# Patient Record
Sex: Female | Born: 1995
Health system: Southern US, Community
[De-identification: ages and names within clinical notes are randomized; demographics above are authoritative.]

## PROBLEM LIST (undated history)

## (undated) DIAGNOSIS — A549 Gonococcal infection, unspecified: Secondary | ICD-10-CM

## (undated) DIAGNOSIS — A749 Chlamydial infection, unspecified: Secondary | ICD-10-CM

## (undated) DIAGNOSIS — M545 Low back pain, unspecified: Secondary | ICD-10-CM

## (undated) DIAGNOSIS — N3281 Overactive bladder: Secondary | ICD-10-CM

## (undated) DIAGNOSIS — G43909 Migraine, unspecified, not intractable, without status migrainosus: Secondary | ICD-10-CM

## (undated) DIAGNOSIS — Z3009 Encounter for other general counseling and advice on contraception: Secondary | ICD-10-CM

## (undated) DIAGNOSIS — F411 Generalized anxiety disorder: Secondary | ICD-10-CM

## (undated) HISTORY — DX: Gonococcal infection, unspecified: A54.9

## (undated) HISTORY — DX: Overactive bladder: N32.81

## (undated) HISTORY — PX: WISDOM TOOTH EXTRACTION: SHX21

## (undated) HISTORY — DX: Generalized anxiety disorder: F41.1

## (undated) HISTORY — DX: Low back pain: M54.5

## (undated) HISTORY — DX: Encounter for other general counseling and advice on contraception: Z30.09

## (undated) HISTORY — DX: Migraine, unspecified, not intractable, without status migrainosus: G43.909

## (undated) HISTORY — DX: Low back pain, unspecified: M54.50

## (undated) HISTORY — DX: Chlamydial infection, unspecified: A74.9

---
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
## 2013-03-15 DIAGNOSIS — A749 Chlamydial infection, unspecified: Secondary | ICD-10-CM

## 2013-03-15 DIAGNOSIS — A549 Gonococcal infection, unspecified: Secondary | ICD-10-CM

## 2013-03-15 HISTORY — DX: Chlamydial infection, unspecified: A74.9

## 2013-03-15 HISTORY — DX: Gonococcal infection, unspecified: A54.9

## 2013-03-15 NOTE — L&D Delivery Note (Signed)
Delivery Note At 9:11 AM a viable and healthy female was delivered via Vaginal, Spontaneous Delivery (Presentation: Right Occiput Anterior).  APGAR: 9, 9; weight .   Placenta status: Intact, Spontaneous.  Cord: 3 vessels with the following complications: None.    Anesthesia: Epidural  Episiotomy: None Lacerations: Labial Suture Repair: 4-0 monocryl Est. Blood Loss (mL): 350  Mom to postpartum.  Baby to Couplet care / Skin to Skin.  [facility] 12/09/2013, 9:56 AM

## 2013-03-15 NOTE — L&D Delivery Note (Signed)
`````  Attestation of Attending Supervision of Advanced Practitioner: Evaluation and management procedures were performed by the PA/NP/CNM/OB Fellow under my supervision/collaboration. Chart reviewed and agree with management and plan.  Julian Medina V 12/09/2013 1:05 PM    

## 2013-04-01 IMAGING — CR DG THORACOLUMBAR SPINE 2V
2 series · 2 of 2 positions shown · non-contrast
Comparison: None.

CLINICAL DATA: Winging of right scapula.  Back pain.

THORACOLUMBAR SPINE - 2 VIEW

[view not recorded (1 of 2)]
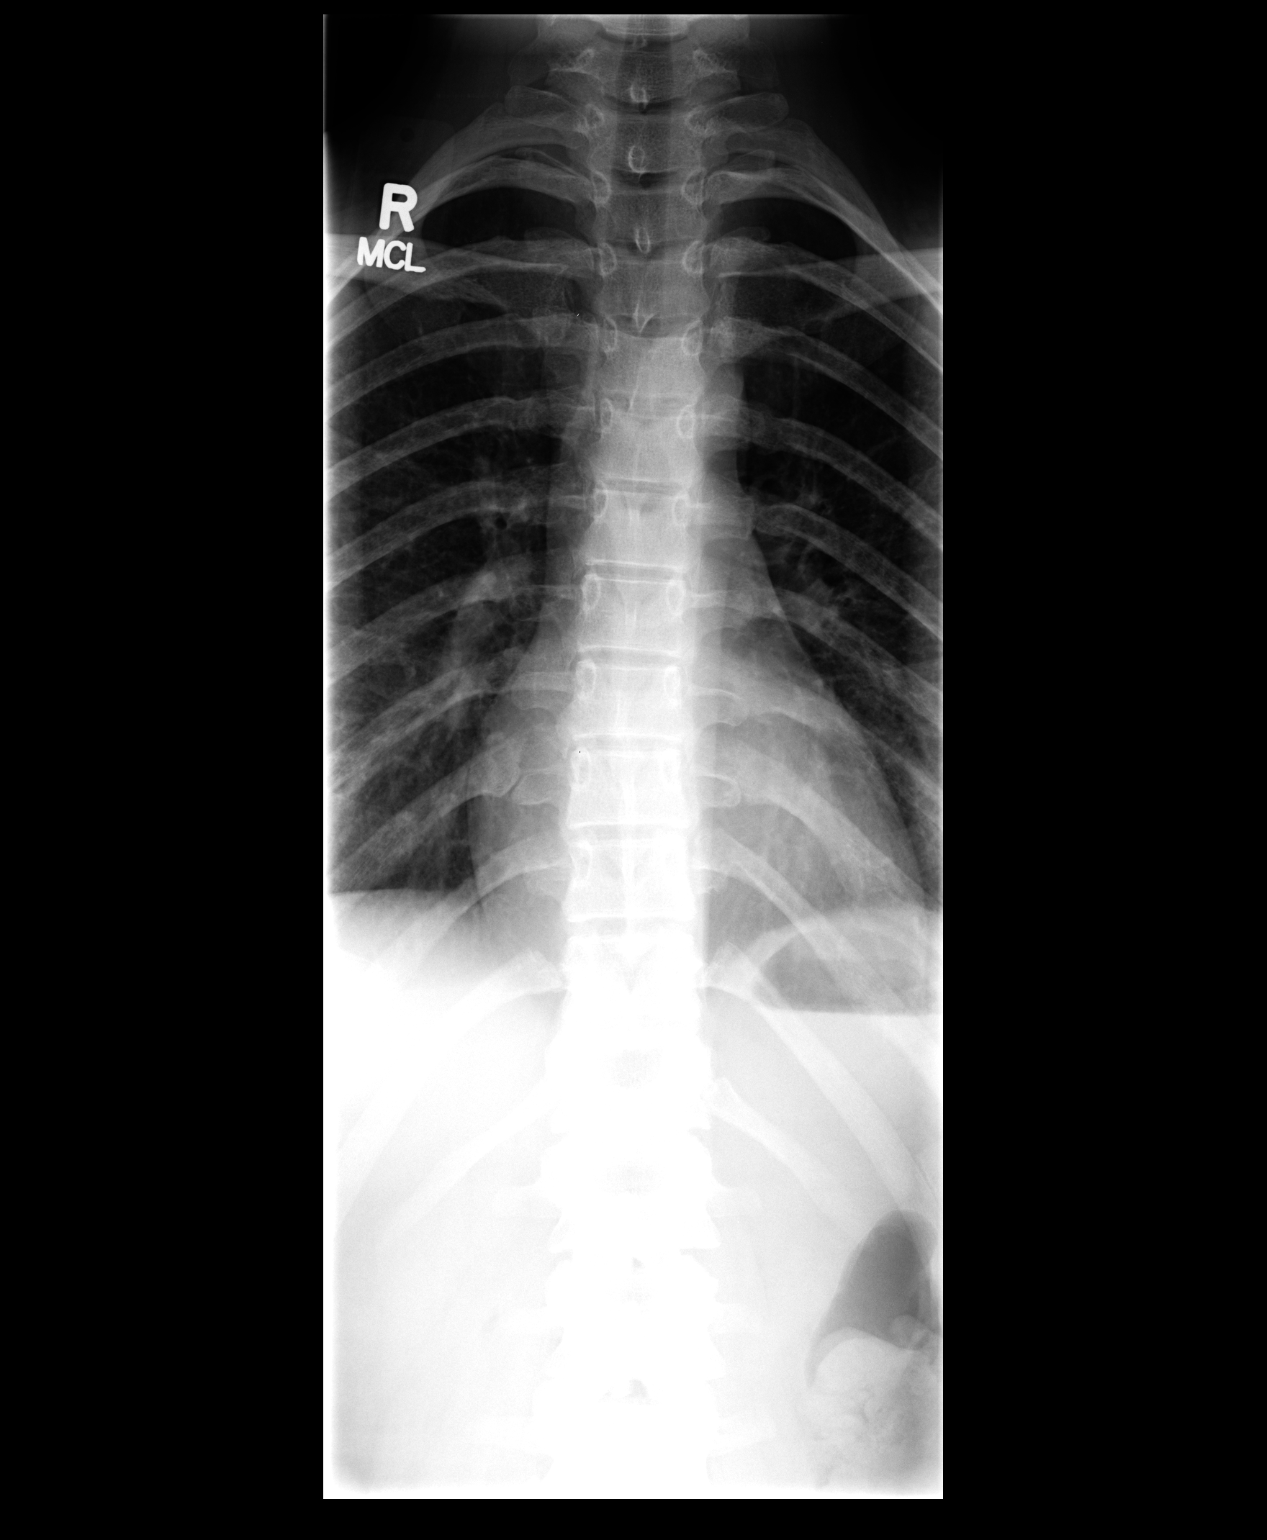

[view not recorded (2 of 2)]
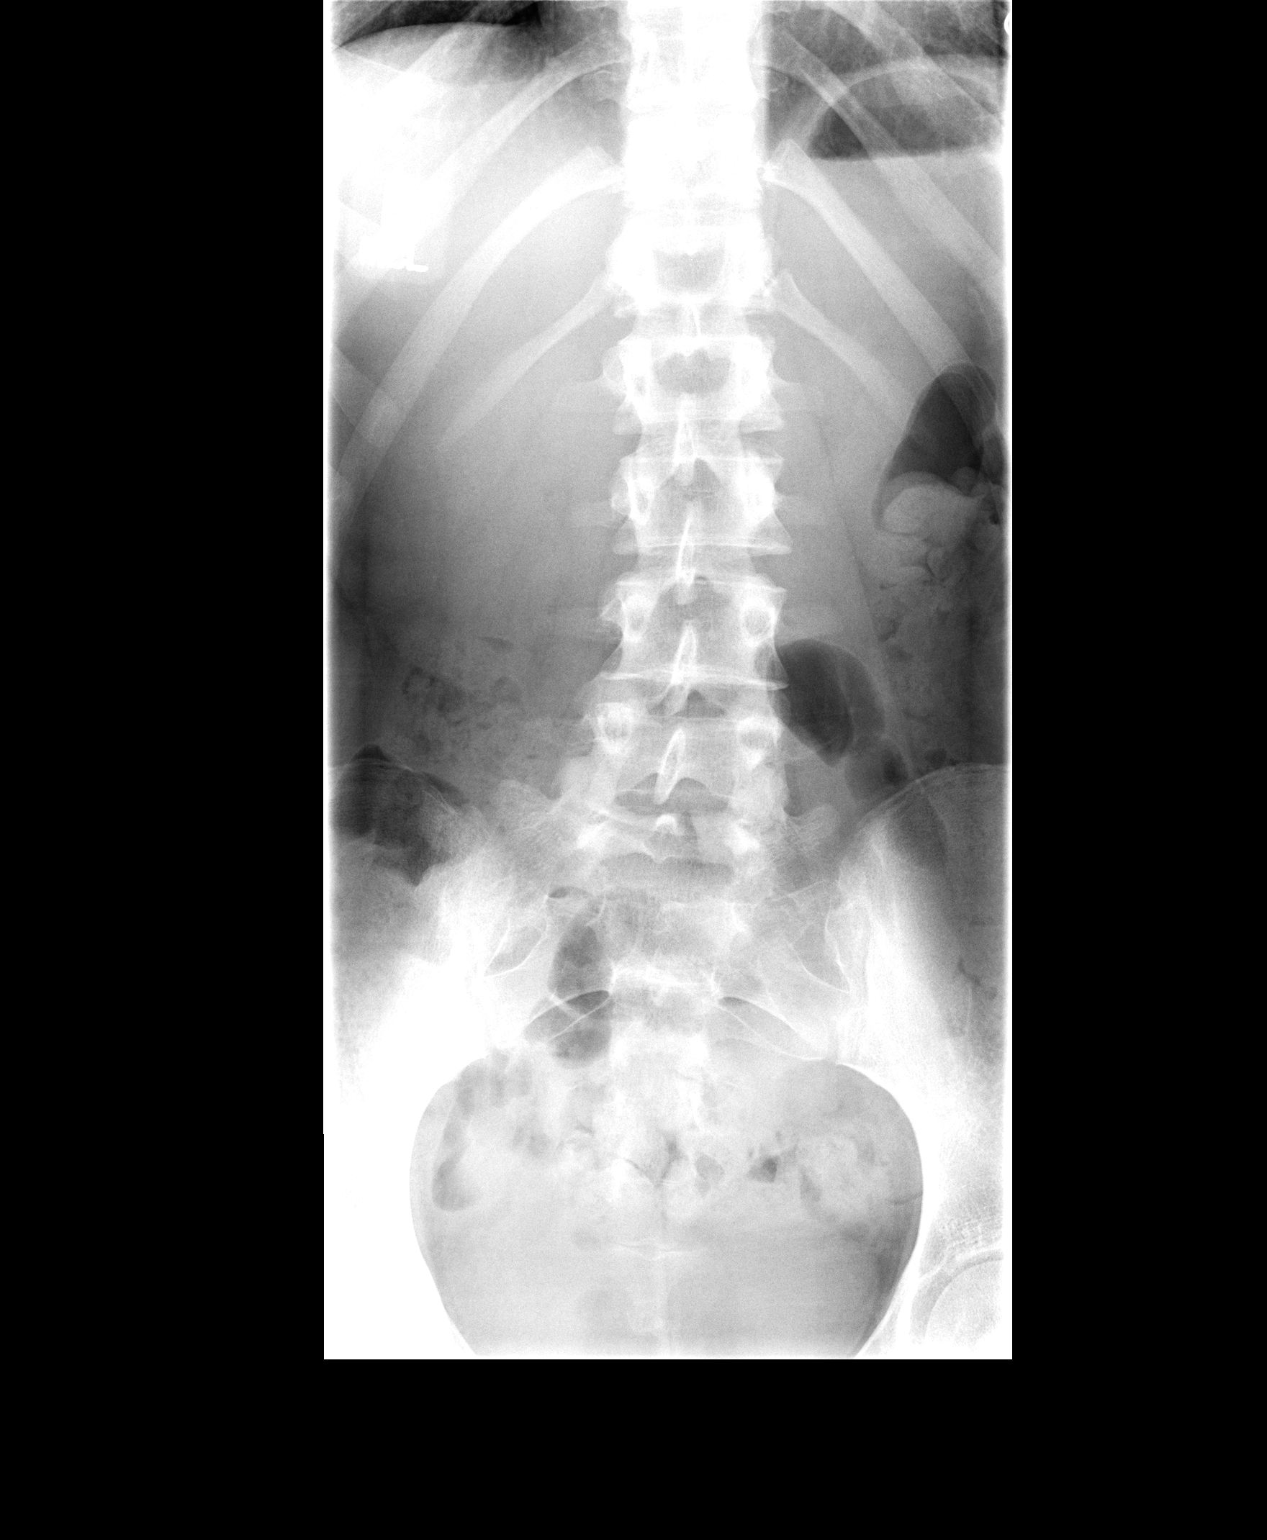

[2 of 2 positions shown; findings below may reference images not displayed]

FINDINGS: Approximately 4 degrees of dextroconvex curvature is
centered at T8-9.  No segmentation anomalies.  Approximately 11
degrees of levoconvex curvature centered at L2-3.
IMPRESSION: Mild S-shaped curvature of the thoracolumbar spine.

## 2013-04-19 ENCOUNTER — Other Ambulatory Visit: Payer: Self-pay | Admitting: Obstetrics & Gynecology

## 2013-04-19 DIAGNOSIS — O3680X0 Pregnancy with inconclusive fetal viability, not applicable or unspecified: Secondary | ICD-10-CM

## 2013-04-20 ENCOUNTER — Telehealth: Payer: Self-pay | Admitting: Obstetrics & Gynecology

## 2013-04-20 NOTE — Telephone Encounter (Signed)
Pt states that she had a little lite spotting this morning. Light pink spotting only once this morning. Pt advised that things sounded normal but she should push her fluids and watch for more spotting. If bright red bleeding or bad cramping occurs call back or go to ER. PT verbalized understanding.

## 2013-04-25 ENCOUNTER — Ambulatory Visit (INDEPENDENT_AMBULATORY_CARE_PROVIDER_SITE_OTHER): Payer: Medicaid Other

## 2013-04-25 DIAGNOSIS — O3680X0 Pregnancy with inconclusive fetal viability, not applicable or unspecified: Secondary | ICD-10-CM

## 2013-04-25 NOTE — Progress Notes (Signed)
US single IUP at [id number] with FHR of 157. Cx appears closed. Bilateral ovaries appear nml.

## 2013-05-02 ENCOUNTER — Encounter: Payer: Self-pay | Admitting: Advanced Practice Midwife

## 2013-05-02 ENCOUNTER — Ambulatory Visit (INDEPENDENT_AMBULATORY_CARE_PROVIDER_SITE_OTHER): Payer: Medicaid Other | Admitting: Advanced Practice Midwife

## 2013-05-02 VITALS — BP 120/60 | Wt 134.0 lb

## 2013-05-02 DIAGNOSIS — Z331 Pregnant state, incidental: Secondary | ICD-10-CM

## 2013-05-02 DIAGNOSIS — O210 Mild hyperemesis gravidarum: Secondary | ICD-10-CM

## 2013-05-02 DIAGNOSIS — O98811 Other maternal infectious and parasitic diseases complicating pregnancy, first trimester: Secondary | ICD-10-CM

## 2013-05-02 DIAGNOSIS — Z3400 Encounter for supervision of normal first pregnancy, unspecified trimester: Secondary | ICD-10-CM

## 2013-05-02 DIAGNOSIS — A749 Chlamydial infection, unspecified: Secondary | ICD-10-CM

## 2013-05-02 DIAGNOSIS — Z3490 Encounter for supervision of normal pregnancy, unspecified, unspecified trimester: Secondary | ICD-10-CM

## 2013-05-02 DIAGNOSIS — O9989 Other specified diseases and conditions complicating pregnancy, childbirth and the puerperium: Secondary | ICD-10-CM

## 2013-05-02 DIAGNOSIS — O98319 Other infections with a predominantly sexual mode of transmission complicating pregnancy, unspecified trimester: Secondary | ICD-10-CM

## 2013-05-02 DIAGNOSIS — Z2839 Other underimmunization status: Secondary | ICD-10-CM

## 2013-05-02 DIAGNOSIS — Z1389 Encounter for screening for other disorder: Secondary | ICD-10-CM

## 2013-05-02 DIAGNOSIS — Z283 Underimmunization status: Secondary | ICD-10-CM

## 2013-05-02 LAB — CBC
HEMATOCRIT: 39.6 % (ref 36.0–46.0)
HEMOGLOBIN: 13.5 g/dL (ref 12.0–15.0)
MCH: 29.1 pg (ref 26.0–34.0)
MCHC: 34.1 g/dL (ref 30.0–36.0)
MCV: 85.3 fL (ref 78.0–100.0)
Platelets: 327 10*3/uL (ref 150–400)
RBC: 4.64 MIL/uL (ref 3.87–5.11)
RDW: 13 % (ref 11.5–15.5)
WBC: 9.6 10*3/uL (ref 4.0–10.5)

## 2013-05-02 LAB — POCT URINALYSIS DIPSTICK
Blood, UA: NEGATIVE
Glucose, UA: NEGATIVE
KETONES UA: NEGATIVE
Nitrite, UA: NEGATIVE
PROTEIN UA: NEGATIVE

## 2013-05-02 NOTE — Patient Instructions (Signed)
Pregnancy - First Trimester During sexual intercourse, millions of sperm go into the vagina. Only 1 sperm will penetrate and fertilize the female egg while it is in the Fallopian tube. One week later, the fertilized egg implants into the wall of the uterus. An embryo begins to develop into a baby. At 6 to 8 weeks, the eyes and face are formed and the heartbeat can be seen on ultrasound. At the end of 12 weeks (first trimester), all the baby's organs are formed. Now that you are pregnant, you will want to do everything you can to have a healthy baby. Two of the most important things are to get good prenatal care and follow your caregiver's instructions. Prenatal care is all the medical care you receive before the baby's birth. It is given to prevent, find, and treat problems during the pregnancy and childbirth. PRENATAL EXAMS  During prenatal visits, your weight, blood pressure, and urine are checked. This is done to make sure you are healthy and progressing normally during the pregnancy.  A pregnant woman should gain 25 to 35 pounds during the pregnancy. However, if you are overweight or underweight, your caregiver will advise you regarding your weight.  Your caregiver will ask and answer questions for you.  Blood work, cervical cultures, other necessary tests, and a Pap test are done during your prenatal exams. These tests are done to check on your health and the probable health of your baby. Tests are strongly recommended and done for HIV with your permission. This is the virus that causes AIDS. These tests are done because medicines can be given to help prevent your baby from being born with this infection should you have been infected without knowing it. Blood work is also used to find out your blood type, previous infections, and follow your blood levels (hemoglobin).  Low hemoglobin (anemia) is common during pregnancy. Iron and vitamins are given to help prevent this. Later in the pregnancy, blood  tests for diabetes will be done along with any other tests if any problems develop.  You may need other tests to make sure you and the baby are doing well. CHANGES DURING THE FIRST TRIMESTER  Your body goes through many changes during pregnancy. They vary from person to person. Talk to your caregiver about changes you notice and are concerned about. Changes can include:  Your menstrual period stops.  The egg and sperm carry the genes that determine what you look like. Genes from you and your partner are forming a baby. The female genes determine whether the baby is a boy or a girl.  Your body increases in girth and you may feel bloated.  Feeling sick to your stomach (nauseous) and throwing up (vomiting). If the vomiting is uncontrollable, call your caregiver.  Your breasts will begin to enlarge and become tender.  Your nipples may stick out more and become darker.  The need to urinate more. Painful urination may mean you have a bladder infection.  Tiring easily.  Loss of appetite.  Cravings for certain kinds of food.  At first, you may gain or lose a couple of pounds.  You may have changes in your emotions from day to day (excited to be pregnant or concerned something may go wrong with the pregnancy and baby).  You may have more vivid and strange dreams. HOME CARE INSTRUCTIONS   It is very important to avoid all smoking, alcohol and non-prescribed drugs during your pregnancy. These affect the formation and growth of the baby.   Avoid chemicals while pregnant to ensure the delivery of a healthy infant.  Start your prenatal visits by the 12th week of pregnancy. They are usually scheduled monthly at first, then more often in the last 2 months before delivery. Keep your caregiver's appointments. Follow your caregiver's instructions regarding medicine use, blood and lab tests, exercise, and diet.  During pregnancy, you are providing food for you and your baby. Eat regular, well-balanced  meals. Choose foods such as meat, fish, milk and other low fat dairy products, vegetables, fruits, and whole-grain breads and cereals. Your caregiver will tell you of the ideal weight gain.  You can help morning sickness by keeping soda crackers at the bedside. Eat a couple before arising in the morning. You may want to use the crackers without salt on them.  Eating 4 to 5 small meals rather than 3 large meals a day also may help the nausea and vomiting.  Drinking liquids between meals instead of during meals also seems to help nausea and vomiting.  A physical sexual relationship may be continued throughout pregnancy if there are no other problems. Problems may be early (premature) leaking of amniotic fluid from the membranes, vaginal bleeding, or belly (abdominal) pain.  Exercise regularly if there are no restrictions. Check with your caregiver or physical therapist if you are unsure of the safety of some of your exercises. Greater weight gain will occur in the last 2 trimesters of pregnancy. Exercising will help:  Control your weight.  Keep you in shape.  Prepare you for labor and delivery.  Help you lose your pregnancy weight after you deliver your baby.  Wear a good support or jogging bra for breast tenderness during pregnancy. This may help if worn during sleep too.  Ask when prenatal classes are available. Begin classes when they are offered.  Do not use hot tubs, steam rooms, or saunas.  Wear your seat belt when driving. This protects you and your baby if you are in an accident.  Avoid raw meat, uncooked cheese, cat litter boxes, and soil used by cats throughout the pregnancy. These carry germs that can cause birth defects in the baby.  The first trimester is a good time to visit your dentist for your dental health. Getting your teeth cleaned is okay. Use a softer toothbrush and brush gently during pregnancy.  Ask for help if you have financial, counseling, or nutritional needs  during pregnancy. Your caregiver will be able to offer counseling for these needs as well as refer you for other special needs.  Do not take any medicines or herbs unless told by your caregiver.  Inform your caregiver if there is any mental or physical domestic violence.  Make a list of emergency phone numbers of family, friends, hospital, and police and fire departments.  Write down your questions. Take them to your prenatal visit.  Do not douche.  Do not cross your legs.  If you have to stand for long periods of time, rotate you feet or take small steps in a circle.  You may have more vaginal secretions that may require a sanitary pad. Do not use tampons or scented sanitary pads. MEDICINES AND DRUG USE IN PREGNANCY  Take prenatal vitamins as directed. The vitamin should contain 1 milligram of folic acid. Keep all vitamins out of reach of children. Only a couple vitamins or tablets containing iron may be fatal to a baby or young child when ingested.  Avoid use of all medicines, including herbs, over-the-counter medicines, not   prescribed or suggested by your caregiver. Only take over-the-counter or prescription medicines for pain, discomfort, or fever as directed by your caregiver. Do not use aspirin, ibuprofen, or naproxen unless directed by your caregiver.  Let your caregiver also know about herbs you may be using.  Alcohol is related to a number of birth defects. This includes fetal alcohol syndrome. All alcohol, in any form, should be avoided completely. Smoking will cause low birth rate and premature babies.  Street or illegal drugs are very harmful to the baby. They are absolutely forbidden. A baby born to an addicted mother will be addicted at birth. The baby will go through the same withdrawal an adult does.  Let your caregiver know about any medicines that you have to take and for what reason you take them. SEEK MEDICAL CARE IF:  You have any concerns or worries during your  pregnancy. It is better to call with your questions if you feel they cannot wait, rather than worry about them. SEEK IMMEDIATE MEDICAL CARE IF:   An unexplained oral temperature above 102 F (38.9 C) develops, or as your caregiver suggests.  You have leaking of fluid from the vagina (birth canal). If leaking membranes are suspected, take your temperature and inform your caregiver of this when you call.  There is vaginal spotting or bleeding. Notify your caregiver of the amount and how many pads are used.  You develop a bad smelling vaginal discharge with a change in the color.  You continue to feel sick to your stomach (nauseated) and have no relief from remedies suggested. You vomit blood or coffee ground-like materials.  You lose more than 2 pounds of weight in 1 week.  You gain more than 2 pounds of weight in 1 week and you notice swelling of your face, hands, feet, or legs.  You gain 5 pounds or more in 1 week (even if you do not have swelling of your hands, face, legs, or feet).  You get exposed to [place] measles and have never had them.  You are exposed to fifth disease or chickenpox.  You develop belly (abdominal) pain. Round ligament discomfort is a common non-cancerous (benign) cause of abdominal pain in pregnancy. Your caregiver still must evaluate this.  You develop headache, fever, diarrhea, pain with urination, or shortness of breath.  You fall or are in a car accident or have any kind of trauma.  There is mental or physical violence in your home. Document Released: 02/23/2001 Document Revised: 11/24/2011 Document Reviewed: 08/27/2008 [facility] Patient Information 2014 [place]. Nausea and Vomiting Nausea is a sick feeling that often comes before throwing up (vomiting). Vomiting is a reflex where stomach contents come out of your mouth. Vomiting can cause severe loss of body fluids (dehydration). Children and elderly adults can become dehydrated quickly,  especially if they also have diarrhea. Nausea and vomiting are symptoms of a condition or disease. It is important to find the cause of your symptoms. CAUSES   Direct irritation of the stomach lining. This irritation can result from increased acid production (gastroesophageal reflux disease), infection, food poisoning, taking certain medicines (such as nonsteroidal anti-inflammatory drugs), alcohol use, or tobacco use.  Signals from the brain.These signals could be caused by a headache, heat exposure, an inner ear disturbance, increased pressure in the brain from injury, infection, a tumor, or a concussion, pain, emotional stimulus, or metabolic problems.  An obstruction in the gastrointestinal tract (bowel obstruction).  Illnesses such as diabetes, hepatitis, gallbladder problems, appendicitis, kidney  problems, cancer, sepsis, atypical symptoms of a heart attack, or eating disorders.  Medical treatments such as chemotherapy and radiation.  Receiving medicine that makes you sleep (general anesthetic) during surgery. DIAGNOSIS Your caregiver may ask for tests to be done if the problems do not improve after a few days. Tests may also be done if symptoms are severe or if the reason for the nausea and vomiting is not clear. Tests may include:  Urine tests.  Blood tests.  Stool tests.  Cultures (to look for evidence of infection).  X-rays or other imaging studies. Test results can help your caregiver make decisions about treatment or the need for additional tests. TREATMENT You need to stay well hydrated. Drink frequently but in small amounts.You may wish to drink water, sports drinks, clear broth, or eat frozen ice pops or gelatin dessert to help stay hydrated.When you eat, eating slowly may help prevent nausea.There are also some antinausea medicines that may help prevent nausea. HOME CARE INSTRUCTIONS   Take all medicine as directed by your caregiver.  If you do not have an  appetite, do not force yourself to eat. However, you must continue to drink fluids.  If you have an appetite, eat a normal diet unless your caregiver tells you differently.  Eat a variety of complex carbohydrates (rice, wheat, potatoes, bread), lean meats, yogurt, fruits, and vegetables.  Avoid high-fat foods because they are more difficult to digest.  Drink enough water and fluids to keep your urine clear or pale yellow.  If you are dehydrated, ask your caregiver for specific rehydration instructions. Signs of dehydration may include:  Severe thirst.  Dry lips and mouth.  Dizziness.  Dark urine.  Decreasing urine frequency and amount.  Confusion.  Rapid breathing or pulse. SEEK IMMEDIATE MEDICAL CARE IF:   You have blood or brown flecks (like coffee grounds) in your vomit.  You have black or bloody stools.  You have a severe headache or stiff neck.  You are confused.  You have severe abdominal pain.  You have chest pain or trouble breathing.  You do not urinate at least once every 8 hours.  You develop cold or clammy skin.  You continue to vomit for longer than 24 to 48 hours.  You have a fever. MAKE SURE YOU:   Understand these instructions.  Will watch your condition.  Will get help right away if you are not doing well or get worse. Document Released: 03/01/2005 Document Revised: 05/24/2011 Document Reviewed: 07/29/2010 [facility] Patient Information 2014 [place], [place].

## 2013-05-02 NOTE — Progress Notes (Signed)
  Subjective:    Sara Foster is a G1P0 7952w3d being seen today for her first obstetrical visit.  Her obstetrical history is significant for nothing. Patient reports fatigue, nausea and vaginal discharge, spotting X 1 Filed Vitals:   05/02/13 1430  BP: 120/60  Weight: 134 lb (60.782 kg)    HISTORY: OB History  Gravida Para Term Preterm AB SAB TAB Ectopic Multiple Living  1             # Outcome Date GA Lbr Len/2nd Weight Sex Delivery Anes PTL Lv  1 CUR              Past Medical History  Diagnosis Date  . Migraines    Past Surgical History  Procedure Laterality Date  . No past surgeries     Family History  Problem Relation Age of Onset  . Heart disease    . Arthritis    . Lung disease    . Cancer    . Asthma       Exam       Pelvic Exam:    Perineum: Normal Perineum   Vulva: normal   Vagina:  normal mucosa, normal discharge, no blood, no palpable nodules   Uterus 8week.  + FCA on u/s        Cervix: normal   Adnexa: Not palpable   Urinary:  urethral meatus normal    System: Breast:  normal appearance, no masses or tenderness   Skin: normal coloration and turgor, no rashes    Neurologic: oriented, normal, normal mood   Extremities: normal strength, tone, and muscle mass   HEENT PERRLA   Mouth/Teeth mucous membranes moist, pharynx normal without lesions   Neck supple and no masses   Cardiovascular: regular rate and rhythm   Respiratory:  appears well, vitals normal, no respiratory distress, acyanotic, normal RR   Abdomen: soft, non-tender; bowel sounds normal; no masses,  no organomegaly          Assessment:    Pregnancy: G1P0 Patient Active Problem List   Diagnosis Date Noted  . Pregnant 05/02/2013  . Leg weakness 01/05/2011  . Neck muscle weakness 01/05/2011  . Scoliosis 12/31/2010        Plan:    Diclegis samples given Initial labs drawn. Continue prenatal vitamins  Problem list reviewed and updated  Reviewed n/v relief measures  and warning s/s to report  Reviewed recommended weight gain based on pre-gravid BMI  Encouraged well-balanced diet Genetic Screening discussed Integrated Screen: requested.  Ultrasound discussed; fetal survey: requested.  Follow up in 4 weeks for NT/IT  CRESENZO-DISHMAN,Taye Cato 05/02/2013

## 2013-05-03 DIAGNOSIS — O98811 Other maternal infectious and parasitic diseases complicating pregnancy, first trimester: Secondary | ICD-10-CM

## 2013-05-03 DIAGNOSIS — Z283 Underimmunization status: Secondary | ICD-10-CM | POA: Insufficient documentation

## 2013-05-03 DIAGNOSIS — O9989 Other specified diseases and conditions complicating pregnancy, childbirth and the puerperium: Secondary | ICD-10-CM

## 2013-05-03 DIAGNOSIS — O09899 Supervision of other high risk pregnancies, unspecified trimester: Secondary | ICD-10-CM | POA: Insufficient documentation

## 2013-05-03 DIAGNOSIS — A749 Chlamydial infection, unspecified: Secondary | ICD-10-CM | POA: Insufficient documentation

## 2013-05-03 LAB — URINALYSIS
Bilirubin Urine: NEGATIVE
GLUCOSE, UA: NEGATIVE mg/dL
Ketones, ur: NEGATIVE mg/dL
Nitrite: NEGATIVE
PH: 5.5 (ref 5.0–8.0)
Protein, ur: NEGATIVE mg/dL
SPECIFIC GRAVITY, URINE: 1.019 (ref 1.005–1.030)
Urobilinogen, UA: 0.2 mg/dL (ref 0.0–1.0)

## 2013-05-03 LAB — DRUG SCREEN, URINE, NO CONFIRMATION
Amphetamine Screen, Ur: NEGATIVE
Barbiturate Quant, Ur: NEGATIVE
Benzodiazepines.: NEGATIVE
COCAINE METABOLITES: NEGATIVE
CREATININE, U: 105.9 mg/dL
Marijuana Metabolite: NEGATIVE
Methadone: NEGATIVE
Opiate Screen, Urine: NEGATIVE
PHENCYCLIDINE (PCP): NEGATIVE
PROPOXYPHENE: NEGATIVE

## 2013-05-03 LAB — ABO AND RH: Rh Type: POSITIVE

## 2013-05-03 LAB — HEPATITIS B SURFACE ANTIGEN: Hepatitis B Surface Ag: NEGATIVE

## 2013-05-03 LAB — OXYCODONE SCREEN, UA, RFLX CONFIRM: OXYCODONE SCRN UR: NEGATIVE ng/mL

## 2013-05-03 LAB — ANTIBODY SCREEN: Antibody Screen: NEGATIVE

## 2013-05-03 LAB — RUBELLA SCREEN: Rubella: 0.7 Index (ref <0.90)

## 2013-05-03 LAB — HIV ANTIBODY (ROUTINE TESTING W REFLEX): HIV: NONREACTIVE

## 2013-05-03 LAB — GC/CHLAMYDIA PROBE AMP
CT PROBE, AMP APTIMA: POSITIVE — AB
GC PROBE AMP APTIMA: NEGATIVE

## 2013-05-03 LAB — VARICELLA ZOSTER ANTIBODY, IGG: VARICELLA IGG: 801.2 {index} — AB (ref <135.00)

## 2013-05-03 LAB — RPR

## 2013-05-03 MED ORDER — AZITHROMYCIN 500 MG PO TABS: 1000.0000 mg | ORAL_TABLET | Freq: Once | ORAL | Status: DC

## 2013-05-03 NOTE — Progress Notes (Signed)
CHL +.  Azithromycin 1gm sent for pt/partner.  No unprotected intercourse until POC in 4 weeks.  Pt notified.

## 2013-05-03 NOTE — Addendum Note (Signed)
Addended by: Jacklyn ShellRESENZO-DISHMON, Copeland Lapier on: 05/03/2013 10:16 AM   Modules accepted: Orders

## 2013-05-04 LAB — CYSTIC FIBROSIS DIAGNOSTIC STUDY

## 2013-05-05 LAB — URINE CULTURE

## 2013-05-07 ENCOUNTER — Telehealth: Payer: Self-pay | Admitting: *Deleted

## 2013-05-07 MED ORDER — AMPICILLIN 500 MG PO CAPS: 500.0000 mg | ORAL_CAPSULE | Freq: Four times a day (QID) | ORAL | Status: DC

## 2013-05-07 NOTE — Telephone Encounter (Signed)
Pt informed Culture came back positive [facility] [place] [facility] Rodena PietyFran Cresenzo. Pt states took medication for +CHL, informed will recheck around 3 weeks POC of CHL. Pt verbalized understanding.

## 2013-05-07 NOTE — Telephone Encounter (Signed)
Message copied by Criss AlvinePULLIAM, Aashi Derrington G on Mon May 07, 2013 11:11 AM ------      Message from: Jacklyn ShellRESENZO-DISHMON, FRANCES      Created: Mon May 07, 2013 10:41 AM       Ampicillin 500mg  QID; can you let pt know she will need to pick up her prescription? ------

## 2013-05-07 NOTE — Addendum Note (Signed)
Addended by: Jacklyn ShellRESENZO-DISHMON, Tema Alire on: 05/07/2013 10:39 AM   Modules accepted: Orders

## 2013-05-07 NOTE — Progress Notes (Addendum)
GBS in urine.  Treated with ampicillin 500mg  QID X7

## 2013-05-30 ENCOUNTER — Ambulatory Visit (INDEPENDENT_AMBULATORY_CARE_PROVIDER_SITE_OTHER): Payer: Medicaid Other

## 2013-05-30 ENCOUNTER — Ambulatory Visit (INDEPENDENT_AMBULATORY_CARE_PROVIDER_SITE_OTHER): Payer: Medicaid Other | Admitting: Advanced Practice Midwife

## 2013-05-30 ENCOUNTER — Encounter: Payer: Self-pay | Admitting: Advanced Practice Midwife

## 2013-05-30 ENCOUNTER — Other Ambulatory Visit: Payer: Self-pay | Admitting: Advanced Practice Midwife

## 2013-05-30 VITALS — BP 124/58 | Wt 130.0 lb

## 2013-05-30 DIAGNOSIS — Z3400 Encounter for supervision of normal first pregnancy, unspecified trimester: Secondary | ICD-10-CM

## 2013-05-30 DIAGNOSIS — A7489 Other chlamydial diseases: Secondary | ICD-10-CM

## 2013-05-30 DIAGNOSIS — O210 Mild hyperemesis gravidarum: Secondary | ICD-10-CM

## 2013-05-30 DIAGNOSIS — Z36 Encounter for antenatal screening of mother: Secondary | ICD-10-CM

## 2013-05-30 DIAGNOSIS — Z1389 Encounter for screening for other disorder: Secondary | ICD-10-CM

## 2013-05-30 DIAGNOSIS — Z331 Pregnant state, incidental: Secondary | ICD-10-CM

## 2013-05-30 LAB — POCT URINALYSIS DIPSTICK
Blood, UA: NEGATIVE
Glucose, UA: NEGATIVE
Ketones, UA: NEGATIVE
NITRITE UA: NEGATIVE
PROTEIN UA: NEGATIVE

## 2013-05-30 NOTE — Progress Notes (Signed)
NT/IT today.  POC CHL sent.  No c/o at this time.  Routine questions about pregnancy answered.  F/U in 4 weeks for 2nd IT/Low-risk ob appt .

## 2013-05-30 NOTE — Progress Notes (Signed)
U/S(12+3wks)-single IUP with +FCA noted, FHR-153 BPM, cx appears closed (3.2cm), anterior Gr 0 placenta, bilateral adnexa WNL, NB present, NT-1.2784mm, CRL c/w dates

## 2013-06-01 LAB — GC/CHLAMYDIA PROBE AMP
CT Probe RNA: NEGATIVE
GC Probe RNA: NEGATIVE

## 2013-06-05 LAB — MATERNAL SCREEN, INTEGRATED #1

## 2013-06-29 ENCOUNTER — Ambulatory Visit (INDEPENDENT_AMBULATORY_CARE_PROVIDER_SITE_OTHER): Payer: Medicaid Other | Admitting: Obstetrics and Gynecology

## 2013-06-29 ENCOUNTER — Encounter: Payer: Self-pay | Admitting: Obstetrics and Gynecology

## 2013-06-29 VITALS — BP 120/74 | Wt 133.0 lb

## 2013-06-29 DIAGNOSIS — O2390 Unspecified genitourinary tract infection in pregnancy, unspecified trimester: Secondary | ICD-10-CM

## 2013-06-29 DIAGNOSIS — Z331 Pregnant state, incidental: Secondary | ICD-10-CM

## 2013-06-29 DIAGNOSIS — Z1389 Encounter for screening for other disorder: Secondary | ICD-10-CM

## 2013-06-29 DIAGNOSIS — Z3400 Encounter for supervision of normal first pregnancy, unspecified trimester: Secondary | ICD-10-CM | POA: Insufficient documentation

## 2013-06-29 LAB — POCT URINALYSIS DIPSTICK
Glucose, UA: NEGATIVE
Ketones, UA: NEGATIVE
Leukocytes, UA: NEGATIVE
Nitrite, UA: NEGATIVE
PROTEIN UA: NEGATIVE
RBC UA: NEGATIVE

## 2013-06-29 NOTE — Progress Notes (Signed)
1742w5d. G1P0. Here for second NT testing/blood draw for PN2/prental visit. +round ligament pain. No other complaints. Childbirth classes encouraged. Partner has prior child and is involved w/ that child.   This chart was scribed by Bennett Scrapehristina Taylor, Medical Scribe, for Dr. Christin BachJohn Wataru Mccowen on 06/29/13 at 9:41 AM. This chart was reviewed by Dr. Christin BachJohn Damali Broadfoot and is accurate.

## 2013-06-29 NOTE — Progress Notes (Signed)
Pt states that she is still having a yellowish brown discharge, that she has had since she found out she was pregnant.

## 2013-06-29 NOTE — Patient Instructions (Signed)
[place]ase check out [email or web address] and search new-baby-and-parenting-classes  for more information on childbirth classes

## 2013-07-03 LAB — MATERNAL SCREEN, INTEGRATED #2
AFP MoM: 1.35
AFP, Serum: 54.3 ng/mL
Age risk Down Syndrome: 1:1100 {titer}
Calculated Gestational Age: 16.9
Crown Rump Length: 63.7 mm
Estriol Mom: 1.08
Estriol, Free: 1.18 ng/mL
Inhibin A Dimeric: 160 pg/mL
Inhibin A MoM: 0.9
NT MoM: 1.27
NUCHAL TRANSLUCENCY MAT SCREEN 2: 1.84 mm
Number of fetuses: 1
PAPP-A MOM MAT SCREEN: 1.05
PAPP-A: 1143 ng/mL
Rish for ONTD: 1:3900 {titer}
hCG MoM: 1.25
hCG, Serum: 40.3 IU/mL

## 2013-07-07 ENCOUNTER — Encounter: Payer: Self-pay | Admitting: Obstetrics and Gynecology

## 2013-07-20 ENCOUNTER — Ambulatory Visit (INDEPENDENT_AMBULATORY_CARE_PROVIDER_SITE_OTHER): Payer: Medicaid Other

## 2013-07-20 ENCOUNTER — Other Ambulatory Visit: Payer: Self-pay | Admitting: Obstetrics & Gynecology

## 2013-07-20 ENCOUNTER — Encounter: Payer: Self-pay | Admitting: Obstetrics & Gynecology

## 2013-07-20 ENCOUNTER — Ambulatory Visit (INDEPENDENT_AMBULATORY_CARE_PROVIDER_SITE_OTHER): Payer: Medicaid Other | Admitting: Obstetrics & Gynecology

## 2013-07-20 VITALS — BP 120/70 | Wt 135.0 lb

## 2013-07-20 DIAGNOSIS — Z1389 Encounter for screening for other disorder: Secondary | ICD-10-CM

## 2013-07-20 DIAGNOSIS — Z3400 Encounter for supervision of normal first pregnancy, unspecified trimester: Secondary | ICD-10-CM

## 2013-07-20 DIAGNOSIS — Z363 Encounter for antenatal screening for malformations: Secondary | ICD-10-CM

## 2013-07-20 DIAGNOSIS — Z331 Pregnant state, incidental: Secondary | ICD-10-CM

## 2013-07-20 LAB — POCT URINALYSIS DIPSTICK
Glucose, UA: NEGATIVE
KETONES UA: NEGATIVE
Leukocytes, UA: NEGATIVE
Nitrite, UA: NEGATIVE
Protein, UA: NEGATIVE
RBC UA: NEGATIVE

## 2013-07-20 NOTE — Progress Notes (Signed)
U/S(19+5wks)-active fetus, meas c/w dates, fluid wnl, anterior Gr 0 placenta, cx appears closed (4.2cm), bilateral adnexa appears WNL, no major abnl noted, female fetus, FHR-148 bpm

## 2013-07-20 NOTE — Progress Notes (Signed)
Sonogram is reviewed, read and report done.  All normal  BP weight and urine results all reviewed and noted. Patient reports good fetal movement, denies any bleeding and no rupture of membranes symptoms or regular contractions. Patient is without complaints. All questions were answered.

## 2013-07-24 ENCOUNTER — Telehealth: Payer: Self-pay | Admitting: Obstetrics and Gynecology

## 2013-07-24 NOTE — Telephone Encounter (Signed)
Pt states has sore around finger tips draining yellowish discharge, no fever. Pt states boyfriend had staph infection on his stomach. Appt made for tomorrow at 1:45 pm for evaluation.

## 2013-07-25 ENCOUNTER — Encounter: Payer: Medicaid Other | Admitting: Women's Health

## 2013-08-17 ENCOUNTER — Encounter: Payer: Self-pay | Admitting: Adult Health

## 2013-08-17 ENCOUNTER — Ambulatory Visit (INDEPENDENT_AMBULATORY_CARE_PROVIDER_SITE_OTHER): Payer: Self-pay | Admitting: Adult Health

## 2013-08-17 VITALS — BP 112/74 | Wt 136.0 lb

## 2013-08-17 DIAGNOSIS — Z1389 Encounter for screening for other disorder: Secondary | ICD-10-CM

## 2013-08-17 DIAGNOSIS — Z331 Pregnant state, incidental: Secondary | ICD-10-CM

## 2013-08-17 DIAGNOSIS — Z3400 Encounter for supervision of normal first pregnancy, unspecified trimester: Secondary | ICD-10-CM

## 2013-08-17 LAB — POCT URINALYSIS DIPSTICK
Blood, UA: NEGATIVE
GLUCOSE UA: NEGATIVE
KETONES UA: NEGATIVE
Leukocytes, UA: NEGATIVE
Nitrite, UA: NEGATIVE
PROTEIN UA: NEGATIVE

## 2013-08-17 NOTE — Progress Notes (Signed)
G1P0 [redacted]w[redacted]d Estimated Date of Delivery: 12/09/13  Blood pressure 112/74, weight 136 lb (61.689 kg), last menstrual period 03/13/2013.   BP weight and urine results all reviewed and noted.  Please refer to the obstetrical flow sheet for the fundal height and fetal heart rate documentation:  Patient reports good fetal movement, denies any bleeding and no rupture of membranes symptoms or regular contractions. Patient has what sound like round ligament pains and has to void often and sometime if has not drank water will burn ,urine negative today.. All questions were answered.  Plan:  Continued routine obstetrical care,  Follow up in 4 weeks for OB appointment,  And PN2

## 2013-08-17 NOTE — Patient Instructions (Signed)
Fetal Movement Counts Patient Name: __________________________________________________ Patient Due Date: ____________________ Performing a fetal movement count is highly recommended in high-risk pregnancies, but it is good for every pregnant woman to do. Your caregiver may ask you to start counting fetal movements at 28 weeks of the pregnancy. Fetal movements often increase:  After eating a full meal.  After physical activity.  After eating or drinking something sweet or cold.  At rest. Pay attention to when you feel the baby is most active. This will help you notice a pattern of your baby's sleep and wake cycles and what factors contribute to an increase in fetal movement. It is important to perform a fetal movement count at the same time each day when your baby is normally most active.  HOW TO COUNT FETAL MOVEMENTS 1. Find a quiet and comfortable area to sit or lie down on your left side. Lying on your left side provides the best blood and oxygen circulation to your baby. 2. Write down the day and time on a sheet of paper or in a journal. 3. Start counting kicks, flutters, swishes, rolls, or jabs in a 2 hour period. You should feel at least 10 movements within 2 hours. 4. If you do not feel 10 movements in 2 hours, wait 2 3 hours and count again. Look for a change in the pattern or not enough counts in 2 hours. SEEK MEDICAL CARE IF:  You feel less than 10 counts in 2 hours, tried twice.  There is no movement in over an hour.  The pattern is changing or taking longer each day to reach 10 counts in 2 hours.  You feel the baby is not moving as he or she usually does. Date: ____________ Movements: ____________ Start time: ____________ Finish time: ____________  Date: ____________ Movements: ____________ Start time: ____________ Finish time: ____________ Date: ____________ Movements: ____________ Start time: ____________ Finish time: ____________ Date: ____________ Movements: ____________  Start time: ____________ Finish time: ____________ Date: ____________ Movements: ____________ Start time: ____________ Finish time: ____________ Date: ____________ Movements: ____________ Start time: ____________ Finish time: ____________ Date: ____________ Movements: ____________ Start time: ____________ Finish time: ____________ Date: ____________ Movements: ____________ Start time: ____________ Finish time: ____________  Date: ____________ Movements: ____________ Start time: ____________ Finish time: ____________ Date: ____________ Movements: ____________ Start time: ____________ Finish time: ____________ Date: ____________ Movements: ____________ Start time: ____________ Finish time: ____________ Date: ____________ Movements: ____________ Start time: ____________ Finish time: ____________ Date: ____________ Movements: ____________ Start time: ____________ Finish time: ____________ Date: ____________ Movements: ____________ Start time: ____________ Finish time: ____________ Date: ____________ Movements: ____________ Start time: ____________ Finish time: ____________  Date: ____________ Movements: ____________ Start time: ____________ Finish time: ____________ Date: ____________ Movements: ____________ Start time: ____________ Finish time: ____________ Date: ____________ Movements: ____________ Start time: ____________ Finish time: ____________ Date: ____________ Movements: ____________ Start time: ____________ Finish time: ____________ Date: ____________ Movements: ____________ Start time: ____________ Finish time: ____________ Date: ____________ Movements: ____________ Start time: ____________ Finish time: ____________ Date: ____________ Movements: ____________ Start time: ____________ Finish time: ____________  Date: ____________ Movements: ____________ Start time: ____________ Finish time: ____________ Date: ____________ Movements: ____________ Start time: ____________ Finish time:  ____________ Date: ____________ Movements: ____________ Start time: ____________ Finish time: ____________ Date: ____________ Movements: ____________ Start time: ____________ Finish time: ____________ Date: ____________ Movements: ____________ Start time: ____________ Finish time: ____________ Date: ____________ Movements: ____________ Start time: ____________ Finish time: ____________ Date: ____________ Movements: ____________ Start time: ____________ Finish time: ____________  Date: ____________ Movements: ____________ Start time: ____________ Finish   time: ____________ Date: ____________ Movements: ____________ Start time: ____________ Doreatha MartinFinish time: ____________ Date: ____________ Movements: ____________ Start time: ____________ Doreatha MartinFinish time: ____________ Date: ____________ Movements: ____________ Start time: ____________ Doreatha MartinFinish time: ____________ Date: ____________ Movements: ____________ Start time: ____________ Doreatha MartinFinish time: ____________ Date: ____________ Movements: ____________ Start time: ____________ Doreatha MartinFinish time: ____________ Date: ____________ Movements: ____________ Start time: ____________ Doreatha MartinFinish time: ____________  Date: ____________ Movements: ____________ Start time: ____________ Doreatha MartinFinish time: ____________ Date: ____________ Movements: ____________ Start time: ____________ Doreatha MartinFinish time: ____________ Date: ____________ Movements: ____________ Start time: ____________ Doreatha MartinFinish time: ____________ Date: ____________ Movements: ____________ Start time: ____________ Doreatha MartinFinish time: ____________ Date: ____________ Movements: ____________ Start time: ____________ Doreatha MartinFinish time: ____________ Date: ____________ Movements: ____________ Start time: ____________ Doreatha MartinFinish time: ____________ Date: ____________ Movements: ____________ Start time: ____________ Doreatha MartinFinish time: ____________  Date: ____________ Movements: ____________ Start time: ____________ Doreatha MartinFinish time: ____________ Date: ____________ Movements:  ____________ Start time: ____________ Doreatha MartinFinish time: ____________ Date: ____________ Movements: ____________ Start time: ____________ Doreatha MartinFinish time: ____________ Date: ____________ Movements: ____________ Start time: ____________ Doreatha MartinFinish time: ____________ Date: ____________ Movements: ____________ Start time: ____________ Doreatha MartinFinish time: ____________ Date: ____________ Movements: ____________ Start time: ____________ Doreatha MartinFinish time: ____________ Date: ____________ Movements: ____________ Start time: ____________ Doreatha MartinFinish time: ____________  Date: ____________ Movements: ____________ Start time: ____________ Doreatha MartinFinish time: ____________ Date: ____________ Movements: ____________ Start time: ____________ Doreatha MartinFinish time: ____________ Date: ____________ Movements: ____________ Start time: ____________ Doreatha MartinFinish time: ____________ Date: ____________ Movements: ____________ Start time: ____________ Doreatha MartinFinish time: ____________ Date: ____________ Movements: ____________ Start time: ____________ Doreatha MartinFinish time: ____________ Date: ____________ Movements: ____________ Start time: ____________ Doreatha MartinFinish time: ____________ Document Released: 03/31/2006 Document Revised: 02/16/2012 Document Reviewed: 12/27/2011 [employer] Patient Information 2014 [employer]. Third Trimester of Pregnancy The third trimester is from week 29 through week 42, months 7 through 9. The third trimester is a time when the fetus is growing rapidly. At the end of the ninth month, the fetus is about 20 inches in length and weighs 6 10 pounds.  BODY CHANGES Your body goes through many changes during pregnancy. The changes vary from woman to woman.   Your weight will continue to increase. You can expect to gain 25 35 pounds (11 16 kg) by the end of the pregnancy.  You may begin to get stretch marks on your hips, abdomen, and breasts.  You may urinate more often because the fetus is moving lower into your pelvis and pressing on your bladder.  You may develop or  continue to have heartburn as a result of your pregnancy.  You may develop constipation because certain hormones are causing the muscles that push waste through your intestines to slow down.  You may develop hemorrhoids or swollen, bulging veins (varicose veins).  You may have pelvic pain because of the weight gain and pregnancy hormones relaxing your joints between the bones in your pelvis. Back aches may result from over exertion of the muscles supporting your posture.  Your breasts will continue to grow and be tender. A yellow discharge may leak from your breasts called colostrum.  Your belly button may stick out.  You may feel short of breath because of your expanding uterus.  You may notice the fetus "dropping," or moving lower in your abdomen.  You may have a bloody mucus discharge. This usually occurs a few days to a week before labor begins.  Your cervix becomes thin and soft (effaced) near your due date. WHAT TO EXPECT AT YOUR PRENATAL EXAMS  You will have prenatal exams every 2 weeks until week 36. Then, you will have weekly  prenatal exams. During a routine prenatal visit:  You will be weighed to make sure you and the fetus are growing normally.  Your blood pressure is taken.  Your abdomen will be measured to track your baby's growth.  The fetal heartbeat will be listened to.  Any test results from the previous visit will be discussed.  You may have a cervical check near your due date to see if you have effaced. At around 36 weeks, your caregiver will check your cervix. At the same time, your caregiver will also perform a test on the secretions of the vaginal tissue. This test is to determine if a type of bacteria, Group B streptococcus, is present. Your caregiver will explain this further. Your caregiver may ask you:  What your birth plan is.  How you are feeling.  If you are feeling the baby move.  If you have had any abnormal symptoms, such as leaking fluid,  bleeding, severe headaches, or abdominal cramping.  If you have any questions. Other tests or screenings that may be performed during your third trimester include:  Blood tests that check for low iron levels (anemia).  Fetal testing to check the health, activity level, and growth of the fetus. Testing is done if you have certain medical conditions or if there are problems during the pregnancy. FALSE LABOR You may feel small, irregular contractions that eventually go away. These are called Braxton Hicks contractions, or false labor. Contractions may last for hours, days, or even weeks before true labor sets in. If contractions come at regular intervals, intensify, or become painful, it is best to be seen by your caregiver.  SIGNS OF LABOR   Menstrual-like cramps.  Contractions that are 5 minutes apart or less.  Contractions that start on the top of the uterus and spread down to the lower abdomen and back.  A sense of increased pelvic pressure or back pain.  A watery or bloody mucus discharge that comes from the vagina. If you have any of these signs before the 37th week of pregnancy, call your caregiver right away. You need to go to the hospital to get checked immediately. HOME CARE INSTRUCTIONS   Avoid all smoking, herbs, alcohol, and unprescribed drugs. These chemicals affect the formation and growth of the baby.  Follow your caregiver's instructions regarding medicine use. There are medicines that are either safe or unsafe to take during pregnancy.  Exercise only as directed by your caregiver. Experiencing uterine cramps is a good sign to stop exercising.  Continue to eat regular, healthy meals.  Wear a good support bra for breast tenderness.  Do not use hot tubs, steam rooms, or saunas.  Wear your seat belt at all times when driving.  Avoid raw meat, uncooked cheese, cat litter boxes, and soil used by cats. These carry germs that can cause birth defects in the baby.  Take  your prenatal vitamins.  Try taking a stool softener (if your caregiver approves) if you develop constipation. Eat more high-fiber foods, such as fresh vegetables or fruit and whole grains. Drink plenty of fluids to keep your urine clear or pale yellow.  Take warm sitz baths to soothe any pain or discomfort caused by hemorrhoids. Use hemorrhoid cream if your caregiver approves.  If you develop varicose veins, wear support hose. Elevate your feet for 15 minutes, 3 4 times a day. Limit salt in your diet.  Avoid heavy lifting, wear low heal shoes, and practice good posture.  Rest a lot with your  legs elevated if you have leg cramps or low back pain.  Visit your dentist if you have not gone during your pregnancy. Use a soft toothbrush to brush your teeth and be gentle when you floss.  A sexual relationship may be continued unless your caregiver directs you otherwise.  Do not travel far distances unless it is absolutely necessary and only with the approval of your caregiver.  Take prenatal classes to understand, practice, and ask questions about the labor and delivery.  Make a trial run to the hospital.  Pack your hospital bag.  Prepare the baby's nursery.  Continue to go to all your prenatal visits as directed by your caregiver. SEEK MEDICAL CARE IF:  You are unsure if you are in labor or if your water has broken.  You have dizziness.  You have mild pelvic cramps, pelvic pressure, or nagging pain in your abdominal area.  You have persistent nausea, vomiting, or diarrhea.  You have a bad smelling vaginal discharge.  You have pain with urination. SEEK IMMEDIATE MEDICAL CARE IF:   You have a fever.  You are leaking fluid from your vagina.  You have spotting or bleeding from your vagina.  You have severe abdominal cramping or pain.  You have rapid weight loss or gain.  You have shortness of breath with chest pain.  You notice sudden or extreme swelling of your face,  hands, ankles, feet, or legs.  You have not felt your baby move in over an hour.  You have severe headaches that do not go away with medicine.  You have vision changes. Document Released: 02/23/2001 Document Revised: 11/01/2012 Document Reviewed: 05/02/2012 [facility] Patient Information 2014 [place], [place]. Return in 4 weeks for PN2

## 2013-09-13 ENCOUNTER — Ambulatory Visit (INDEPENDENT_AMBULATORY_CARE_PROVIDER_SITE_OTHER): Payer: Self-pay | Admitting: Obstetrics & Gynecology

## 2013-09-13 ENCOUNTER — Encounter: Payer: Self-pay | Admitting: Obstetrics & Gynecology

## 2013-09-13 ENCOUNTER — Other Ambulatory Visit: Payer: Medicaid Other

## 2013-09-13 VITALS — BP 120/80 | Wt 143.0 lb

## 2013-09-13 DIAGNOSIS — Z331 Pregnant state, incidental: Secondary | ICD-10-CM

## 2013-09-13 DIAGNOSIS — Z3402 Encounter for supervision of normal first pregnancy, second trimester: Secondary | ICD-10-CM

## 2013-09-13 DIAGNOSIS — Z3400 Encounter for supervision of normal first pregnancy, unspecified trimester: Secondary | ICD-10-CM

## 2013-09-13 DIAGNOSIS — Z1389 Encounter for screening for other disorder: Secondary | ICD-10-CM

## 2013-09-13 LAB — POCT URINALYSIS DIPSTICK
GLUCOSE UA: NEGATIVE
KETONES UA: NEGATIVE
Nitrite, UA: NEGATIVE
Protein, UA: NEGATIVE
RBC UA: NEGATIVE

## 2013-09-13 LAB — CBC
HCT: 35.5 % — ABNORMAL LOW (ref 36.0–46.0)
Hemoglobin: 12.2 g/dL (ref 12.0–15.0)
MCH: 29.8 pg (ref 26.0–34.0)
MCHC: 34.4 g/dL (ref 30.0–36.0)
MCV: 86.8 fL (ref 78.0–100.0)
PLATELETS: 236 10*3/uL (ref 150–400)
RBC: 4.09 MIL/uL (ref 3.87–5.11)
RDW: 13.9 % (ref 11.5–15.5)
WBC: 9.8 10*3/uL (ref 4.0–10.5)

## 2013-09-13 NOTE — Progress Notes (Signed)
G1P0 6876w4d Estimated Date of Delivery: 12/09/13  Blood pressure 120/80, weight 143 lb (64.864 kg), last menstrual period 03/13/2013.   BP weight and urine results all reviewed and noted.  Please refer to the obstetrical flow sheet for the fundal height and fetal heart rate documentation:  Patient reports good fetal movement, denies any bleeding and no rupture of membranes symptoms or regular contractions. Patient is without complaints. All questions were answered.  Plan:  Continued routine obstetrical care,   Follow up in 3 weeks for OB appointment, routine

## 2013-09-14 LAB — HIV ANTIBODY (ROUTINE TESTING W REFLEX): HIV: NONREACTIVE

## 2013-09-14 LAB — GLUCOSE TOLERANCE, 2 HOURS W/ 1HR
GLUCOSE, FASTING: 67 mg/dL — AB (ref 70–99)
Glucose, 1 hour: 100 mg/dL (ref 70–170)
Glucose, 2 hour: 100 mg/dL (ref 70–139)

## 2013-09-14 LAB — ANTIBODY SCREEN: Antibody Screen: NEGATIVE

## 2013-09-14 LAB — RPR

## 2013-09-17 LAB — HSV 2 ANTIBODY, IGG: HSV 2 Glycoprotein G Ab, IgG: 16.94 IV — ABNORMAL HIGH

## 2013-10-05 ENCOUNTER — Encounter: Payer: Self-pay | Admitting: Obstetrics & Gynecology

## 2013-10-05 ENCOUNTER — Ambulatory Visit (INDEPENDENT_AMBULATORY_CARE_PROVIDER_SITE_OTHER): Payer: Self-pay | Admitting: Obstetrics & Gynecology

## 2013-10-05 VITALS — BP 120/80 | Wt 143.0 lb

## 2013-10-05 DIAGNOSIS — Z3400 Encounter for supervision of normal first pregnancy, unspecified trimester: Secondary | ICD-10-CM

## 2013-10-05 DIAGNOSIS — Z1389 Encounter for screening for other disorder: Secondary | ICD-10-CM

## 2013-10-05 DIAGNOSIS — Z331 Pregnant state, incidental: Secondary | ICD-10-CM

## 2013-10-05 LAB — POCT URINALYSIS DIPSTICK
Blood, UA: NEGATIVE
Glucose, UA: NEGATIVE
Ketones, UA: NEGATIVE
Leukocytes, UA: NEGATIVE
Nitrite, UA: NEGATIVE
Protein, UA: NEGATIVE

## 2013-10-05 NOTE — Progress Notes (Signed)
G1P0 5629w5d Estimated Date of Delivery: 12/09/13  Blood pressure 120/80, weight 143 lb (64.864 kg), last menstrual period 03/13/2013.   BP weight and urine results all reviewed and noted.  Please refer to the obstetrical flow sheet for the fundal height and fetal heart rate documentation:  Patient reports good fetal movement, denies any bleeding and no rupture of membranes symptoms or regular contractions. Patient is without complaints. All questions were answered.  Plan:  Continued routine obstetrical care,   Follow up in 2 weeks for OB appointment, routine

## 2013-10-19 ENCOUNTER — Ambulatory Visit (INDEPENDENT_AMBULATORY_CARE_PROVIDER_SITE_OTHER): Payer: Self-pay | Admitting: Obstetrics & Gynecology

## 2013-10-19 ENCOUNTER — Encounter: Payer: Self-pay | Admitting: Obstetrics & Gynecology

## 2013-10-19 VITALS — BP 120/80 | Wt 146.0 lb

## 2013-10-19 DIAGNOSIS — Z3483 Encounter for supervision of other normal pregnancy, third trimester: Secondary | ICD-10-CM

## 2013-10-19 DIAGNOSIS — Z331 Pregnant state, incidental: Secondary | ICD-10-CM

## 2013-10-19 DIAGNOSIS — Z1389 Encounter for screening for other disorder: Secondary | ICD-10-CM

## 2013-10-19 DIAGNOSIS — Z3480 Encounter for supervision of other normal pregnancy, unspecified trimester: Secondary | ICD-10-CM

## 2013-10-19 LAB — POCT URINALYSIS DIPSTICK
GLUCOSE UA: NEGATIVE
KETONES UA: NEGATIVE
Nitrite, UA: NEGATIVE

## 2013-10-19 NOTE — Progress Notes (Signed)
SSE no blood seen no abnormal   G1P0 7981w5d Estimated Date of Delivery: 12/09/13  Blood pressure 120/80, weight 146 lb (66.225 kg), last menstrual period 03/13/2013.   BP weight and urine results all reviewed and noted.  Please refer to the obstetrical flow sheet for the fundal height and fetal heart rate documentation:  Patient reports good fetal movement, denies any bleeding and no rupture of membranes symptoms or regular contractions. Patient is without complaints. All questions were answered.  Plan:  Continued routine obstetrical care,   Follow up in 2 weeks for OB appointment, routine

## 2013-10-31 ENCOUNTER — Encounter: Payer: Self-pay | Admitting: Obstetrics & Gynecology

## 2013-10-31 DIAGNOSIS — O98519 Other viral diseases complicating pregnancy, unspecified trimester: Secondary | ICD-10-CM

## 2013-10-31 DIAGNOSIS — B009 Herpesviral infection, unspecified: Secondary | ICD-10-CM | POA: Insufficient documentation

## 2013-11-02 ENCOUNTER — Ambulatory Visit (INDEPENDENT_AMBULATORY_CARE_PROVIDER_SITE_OTHER): Payer: Self-pay | Admitting: Obstetrics & Gynecology

## 2013-11-02 ENCOUNTER — Encounter: Payer: Self-pay | Admitting: Obstetrics & Gynecology

## 2013-11-02 VITALS — BP 118/70 | Wt 148.5 lb

## 2013-11-02 DIAGNOSIS — Z3403 Encounter for supervision of normal first pregnancy, third trimester: Secondary | ICD-10-CM

## 2013-11-02 DIAGNOSIS — Z1389 Encounter for screening for other disorder: Secondary | ICD-10-CM

## 2013-11-02 DIAGNOSIS — Z331 Pregnant state, incidental: Secondary | ICD-10-CM

## 2013-11-02 DIAGNOSIS — Z3400 Encounter for supervision of normal first pregnancy, unspecified trimester: Secondary | ICD-10-CM

## 2013-11-02 LAB — POCT URINALYSIS DIPSTICK
Glucose, UA: NEGATIVE
Ketones, UA: NEGATIVE
NITRITE UA: NEGATIVE
PROTEIN UA: NEGATIVE

## 2013-11-02 MED ORDER — ACYCLOVIR 400 MG PO TABS: 400.0000 mg | ORAL_TABLET | Freq: Three times a day (TID) | ORAL | Status: DC

## 2013-11-02 NOTE — Progress Notes (Signed)
Watching fundal height, if continues to be flat will check sonogram for growth  G1P0 1380w5d Estimated Date of Delivery: 12/09/13  Blood pressure 118/70, weight 148 lb 8 oz (67.359 kg), last menstrual period 03/13/2013.   BP weight and urine results all reviewed and noted.  Please refer to the obstetrical flow sheet for the fundal height and fetal heart rate documentation:  Patient reports good fetal movement, denies any bleeding and no rupture of membranes symptoms or regular contractions. Patient is without complaints. All questions were answered.  Plan:  Continued routine obstetrical care,   Follow up in 2 weeks for OB appointment, ob visit

## 2013-11-16 ENCOUNTER — Encounter: Payer: Self-pay | Admitting: Obstetrics and Gynecology

## 2013-11-16 ENCOUNTER — Ambulatory Visit (INDEPENDENT_AMBULATORY_CARE_PROVIDER_SITE_OTHER): Payer: Medicaid Other | Admitting: Obstetrics and Gynecology

## 2013-11-16 VITALS — BP 122/72 | Wt 150.0 lb

## 2013-11-16 DIAGNOSIS — Z1389 Encounter for screening for other disorder: Secondary | ICD-10-CM

## 2013-11-16 DIAGNOSIS — Z3480 Encounter for supervision of other normal pregnancy, unspecified trimester: Secondary | ICD-10-CM

## 2013-11-16 DIAGNOSIS — Z3493 Encounter for supervision of normal pregnancy, unspecified, third trimester: Secondary | ICD-10-CM

## 2013-11-16 DIAGNOSIS — Z1159 Encounter for screening for other viral diseases: Secondary | ICD-10-CM

## 2013-11-16 DIAGNOSIS — O0990 Supervision of high risk pregnancy, unspecified, unspecified trimester: Secondary | ICD-10-CM | POA: Insufficient documentation

## 2013-11-16 DIAGNOSIS — Z331 Pregnant state, incidental: Secondary | ICD-10-CM

## 2013-11-16 DIAGNOSIS — Z3490 Encounter for supervision of normal pregnancy, unspecified, unspecified trimester: Secondary | ICD-10-CM | POA: Insufficient documentation

## 2013-11-16 DIAGNOSIS — Z3685 Encounter for antenatal screening for Streptococcus B: Secondary | ICD-10-CM

## 2013-11-16 LAB — POCT URINALYSIS DIPSTICK
Blood, UA: NEGATIVE
Glucose, UA: NEGATIVE
KETONES UA: NEGATIVE
Nitrite, UA: NEGATIVE
Protein, UA: NEGATIVE

## 2013-11-16 LAB — OB RESULTS CONSOLE GC/CHLAMYDIA
CHLAMYDIA, DNA PROBE: POSITIVE
Gonorrhea: POSITIVE

## 2013-11-16 NOTE — Progress Notes (Signed)
G1P0 [redacted]w[redacted]d Estimated Date of Delivery: 12/09/13  Blood pressure 122/72, weight 150 lb (68.04 kg), last menstrual period 03/13/2013.  Urinalysis: negative, trace of leukocytes present   HPI: She denies any itching, discomfort, or vaginal discharge. 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 She voices concern on whether she will need to have a vaginal birth and if she can breast feed.   BP weight and urine results all reviewed and noted. Patient reports good fetal movement, denies any bleeding and no rupture of membranes symptoms or regular contractions.  Physical Examination:  Pelvic - normal external genitalia, vulva, vagina, cervix, uterus and adnexa,  VULVA: normal appearing vulva with no masses, tenderness or lesions,  VAGINA: normal appearing vagina with normal color and discharge, no lesions,  CERVIX: normal appearing cervix without discharge or lesions, softening,  UTERUS: uterus is normal size, shape, consistency and nontender, ADNEXA: normal adnexa in size, nontender and no masses Fundal Height:  36 cm Fetal Heart rate:  136 Edema:  negative  Patient is without complaints. All questions were answered.  Assessment:  [redacted]w[redacted]d,   Low-risk pregnancy at 36 weeks.  Medication(s) Plans:  none Treatment Plan:  none  Follow up in 1 weeks for OB appt.

## 2013-11-17 LAB — GC/CHLAMYDIA PROBE AMP
CT PROBE, AMP APTIMA: POSITIVE — AB
GC Probe RNA: POSITIVE — AB

## 2013-11-18 LAB — STREP B DNA PROBE: STREP GROUP B AG: DETECTED

## 2013-11-20 ENCOUNTER — Telehealth: Payer: Self-pay | Admitting: Obstetrics and Gynecology

## 2013-11-20 ENCOUNTER — Encounter: Payer: Self-pay | Admitting: Obstetrics and Gynecology

## 2013-11-20 DIAGNOSIS — O98313 Other infections with a predominantly sexual mode of transmission complicating pregnancy, third trimester: Secondary | ICD-10-CM

## 2013-11-20 DIAGNOSIS — A549 Gonococcal infection, unspecified: Secondary | ICD-10-CM

## 2013-11-20 DIAGNOSIS — A568 Sexually transmitted chlamydial infection of other sites: Secondary | ICD-10-CM

## 2013-11-20 MED ORDER — AZITHROMYCIN 500 MG PO TABS: 1000.0000 mg | ORAL_TABLET | Freq: Once | ORAL | Status: DC

## 2013-11-20 NOTE — Telephone Encounter (Signed)
Pt informed of +GC, + CHL.  Pt will have RX for Azithromycin called in, and is to come in this week for tx Rocephin.

## 2013-11-20 NOTE — Assessment & Plan Note (Signed)
For Rx Rocephin asap.

## 2013-11-21 ENCOUNTER — Ambulatory Visit (INDEPENDENT_AMBULATORY_CARE_PROVIDER_SITE_OTHER): Payer: Medicaid Other | Admitting: Adult Health

## 2013-11-21 ENCOUNTER — Encounter: Payer: Self-pay | Admitting: Adult Health

## 2013-11-21 VITALS — BP 116/78 | Ht 63.0 in | Wt 150.0 lb

## 2013-11-21 DIAGNOSIS — O98219 Gonorrhea complicating pregnancy, unspecified trimester: Secondary | ICD-10-CM

## 2013-11-21 DIAGNOSIS — Z1389 Encounter for screening for other disorder: Secondary | ICD-10-CM

## 2013-11-21 DIAGNOSIS — A749 Chlamydial infection, unspecified: Secondary | ICD-10-CM

## 2013-11-21 DIAGNOSIS — A549 Gonococcal infection, unspecified: Secondary | ICD-10-CM

## 2013-11-21 DIAGNOSIS — Z331 Pregnant state, incidental: Secondary | ICD-10-CM

## 2013-11-21 LAB — POCT URINALYSIS DIPSTICK
Blood, UA: NEGATIVE
Glucose, UA: NEGATIVE
Ketones, UA: NEGATIVE
Leukocytes, UA: NEGATIVE
NITRITE UA: NEGATIVE
PROTEIN UA: NEGATIVE

## 2013-11-21 MED ORDER — CEFTRIAXONE SODIUM 1 G IJ SOLR
250.0000 mg | Freq: Once | INTRAMUSCULAR | Status: AC
  Administered 2013-11-21: 250 mg via INTRAMUSCULAR

## 2013-11-21 NOTE — Addendum Note (Signed)
Addended by: Colen Darling on: 11/21/2013 10:56 AM   Modules accepted: Level of Service

## 2013-11-23 ENCOUNTER — Ambulatory Visit (INDEPENDENT_AMBULATORY_CARE_PROVIDER_SITE_OTHER): Payer: Medicaid Other | Admitting: Obstetrics & Gynecology

## 2013-11-23 ENCOUNTER — Encounter: Payer: Self-pay | Admitting: Obstetrics & Gynecology

## 2013-11-23 VITALS — BP 120/70 | Wt 152.0 lb

## 2013-11-23 DIAGNOSIS — Z1389 Encounter for screening for other disorder: Secondary | ICD-10-CM

## 2013-11-23 DIAGNOSIS — Z3403 Encounter for supervision of normal first pregnancy, third trimester: Secondary | ICD-10-CM

## 2013-11-23 DIAGNOSIS — O98219 Gonorrhea complicating pregnancy, unspecified trimester: Secondary | ICD-10-CM

## 2013-11-23 DIAGNOSIS — Z331 Pregnant state, incidental: Secondary | ICD-10-CM

## 2013-11-23 LAB — POCT URINALYSIS DIPSTICK
Glucose, UA: NEGATIVE
Ketones, UA: NEGATIVE
LEUKOCYTES UA: NEGATIVE
NITRITE UA: NEGATIVE
PROTEIN UA: NEGATIVE
RBC UA: NEGATIVE

## 2013-11-23 NOTE — Progress Notes (Signed)
G1P0 [redacted]w[redacted]d Estimated Date of Delivery: 12/09/13  Blood pressure 120/70, weight 152 lb (68.947 kg), last menstrual period 03/13/2013.   BP weight and urine results all reviewed and noted.  Please refer to the obstetrical flow sheet for the fundal height and fetal heart rate documentation:  Patient reports good fetal movement, denies any bleeding and no rupture of membranes symptoms or regular contractions. Patient is without complaints. All questions were answered.  Plan:  Continued routine obstetrical care,   Follow up in 1 weeks for OB appointment, routine

## 2013-11-30 ENCOUNTER — Ambulatory Visit (INDEPENDENT_AMBULATORY_CARE_PROVIDER_SITE_OTHER): Payer: Medicaid Other | Admitting: Obstetrics & Gynecology

## 2013-11-30 ENCOUNTER — Encounter: Payer: Self-pay | Admitting: Obstetrics & Gynecology

## 2013-11-30 VITALS — BP 108/62 | Wt 155.0 lb

## 2013-11-30 DIAGNOSIS — Z331 Pregnant state, incidental: Secondary | ICD-10-CM

## 2013-11-30 DIAGNOSIS — Z1389 Encounter for screening for other disorder: Secondary | ICD-10-CM

## 2013-11-30 DIAGNOSIS — Z3403 Encounter for supervision of normal first pregnancy, third trimester: Secondary | ICD-10-CM

## 2013-11-30 DIAGNOSIS — Z3400 Encounter for supervision of normal first pregnancy, unspecified trimester: Secondary | ICD-10-CM

## 2013-11-30 NOTE — Progress Notes (Signed)
G1P0 [redacted]w[redacted]d Estimated Date of Delivery: 12/09/13  Blood pressure 108/62, weight 155 lb (70.308 kg), last menstrual period 03/13/2013.   BP weight and urine results all reviewed and noted.  Please refer to the obstetrical flow sheet for the fundal height and fetal heart rate documentation:  Patient reports good fetal movement, denies any bleeding and no rupture of membranes symptoms or regular contractions. Patient is without complaints. All questions were answered.  Plan:  Continued routine obstetrical care,   Follow up in 1 weeks for OB appointment, routine

## 2013-11-30 NOTE — Progress Notes (Signed)
Pt denies any problems or concerns at this time.  

## 2013-12-04 ENCOUNTER — Telehealth: Payer: Self-pay | Admitting: Obstetrics & Gynecology

## 2013-12-04 NOTE — Telephone Encounter (Signed)
Pt states had a +GC and +CHL result on 11/16/2013, partner was tested and results were negative. Pt states she has not had another partner, is there anyway her test results are wrong? Pt states has an appt this Friday for POC. Informed pt to discuss with provider on her appt Friday. Pt verbalized understanding.

## 2013-12-07 ENCOUNTER — Ambulatory Visit (INDEPENDENT_AMBULATORY_CARE_PROVIDER_SITE_OTHER): Payer: Medicaid Other | Admitting: Obstetrics & Gynecology

## 2013-12-07 ENCOUNTER — Encounter: Payer: Self-pay | Admitting: Obstetrics & Gynecology

## 2013-12-07 VITALS — BP 120/60 | Wt 158.0 lb

## 2013-12-07 DIAGNOSIS — Z1389 Encounter for screening for other disorder: Secondary | ICD-10-CM

## 2013-12-07 DIAGNOSIS — Z3403 Encounter for supervision of normal first pregnancy, third trimester: Secondary | ICD-10-CM

## 2013-12-07 DIAGNOSIS — Z331 Pregnant state, incidental: Secondary | ICD-10-CM

## 2013-12-07 DIAGNOSIS — Z3400 Encounter for supervision of normal first pregnancy, unspecified trimester: Secondary | ICD-10-CM

## 2013-12-07 LAB — POCT URINALYSIS DIPSTICK
Glucose, UA: NEGATIVE
Glucose, UA: NEGATIVE
Ketones, UA: NEGATIVE
Ketones, UA: NEGATIVE
Leukocytes, UA: NEGATIVE
Nitrite, UA: NEGATIVE
Nitrite, UA: NEGATIVE
PROTEIN UA: NEGATIVE
Protein, UA: NEGATIVE
RBC UA: NEGATIVE
RBC UA: NEGATIVE

## 2013-12-07 MED ORDER — AZITHROMYCIN 500 MG PO TABS: 1000.0000 mg | ORAL_TABLET | Freq: Once | ORAL | Status: DC

## 2013-12-07 MED ORDER — CEFIXIME 400 MG PO TABS: ORAL_TABLET | ORAL | Status: DC

## 2013-12-07 NOTE — Progress Notes (Signed)
G1P0 [redacted]w[redacted]d Estimated Date of Delivery: 12/09/13  Blood pressure 120/60, weight 158 lb (71.668 kg), last menstrual period 03/13/2013.   BP weight and urine results all reviewed and noted.  Please refer to the obstetrical flow sheet for the fundal height and fetal heart rate documentation:  Patient reports good fetal movement, denies any bleeding and no rupture of membranes symptoms or regular contractions. Patient is without complaints. All questions were answered.  Plan:  Continued routine obstetrical care,   Follow up in 1  weeks for OB appointment, routine

## 2013-12-08 ENCOUNTER — Inpatient Hospital Stay (HOSPITAL_COMMUNITY)
Admission: AD | Admit: 2013-12-08 | Discharge: 2013-12-11 | DRG: 775 | Disposition: A | Discharge status: Home / Self Care | Payer: Medicaid Other | Source: Ambulatory Visit | Attending: Obstetrics & Gynecology | Admitting: Obstetrics & Gynecology

## 2013-12-08 ENCOUNTER — Encounter (HOSPITAL_COMMUNITY): Payer: Self-pay

## 2013-12-08 DIAGNOSIS — O368390 Maternal care for abnormalities of the fetal heart rate or rhythm, unspecified trimester, not applicable or unspecified: Secondary | ICD-10-CM | POA: Diagnosis present

## 2013-12-08 DIAGNOSIS — M4120 Other idiopathic scoliosis, site unspecified: Secondary | ICD-10-CM | POA: Diagnosis present

## 2013-12-08 DIAGNOSIS — O99892 Other specified diseases and conditions complicating childbirth: Secondary | ICD-10-CM | POA: Diagnosis present

## 2013-12-08 DIAGNOSIS — O479 False labor, unspecified: Secondary | ICD-10-CM | POA: Diagnosis present

## 2013-12-08 DIAGNOSIS — Z825 Family history of asthma and other chronic lower respiratory diseases: Secondary | ICD-10-CM

## 2013-12-08 DIAGNOSIS — Z283 Underimmunization status: Secondary | ICD-10-CM

## 2013-12-08 DIAGNOSIS — Z22330 Carrier of Group B streptococcus: Secondary | ICD-10-CM

## 2013-12-08 DIAGNOSIS — Z2839 Other underimmunization status: Secondary | ICD-10-CM

## 2013-12-08 DIAGNOSIS — B009 Herpesviral infection, unspecified: Secondary | ICD-10-CM

## 2013-12-08 DIAGNOSIS — O9989 Other specified diseases and conditions complicating pregnancy, childbirth and the puerperium: Secondary | ICD-10-CM

## 2013-12-08 DIAGNOSIS — Z8249 Family history of ischemic heart disease and other diseases of the circulatory system: Secondary | ICD-10-CM

## 2013-12-08 DIAGNOSIS — O98811 Other maternal infectious and parasitic diseases complicating pregnancy, first trimester: Secondary | ICD-10-CM

## 2013-12-08 DIAGNOSIS — A749 Chlamydial infection, unspecified: Secondary | ICD-10-CM

## 2013-12-08 DIAGNOSIS — O98513 Other viral diseases complicating pregnancy, third trimester: Secondary | ICD-10-CM

## 2013-12-08 MED ORDER — FLEET ENEMA 7-19 GM/118ML RE ENEM: 1.0000 | ENEMA | Freq: Once | RECTAL | Status: DC

## 2013-12-08 MED ORDER — ONDANSETRON HCL 4 MG/2ML IJ SOLN
4.0000 mg | Freq: Four times a day (QID) | INTRAMUSCULAR | Status: DC | PRN
  Administered 2013-12-09: 4 mg via INTRAVENOUS
  Filled 2013-12-08: qty 2

## 2013-12-08 MED ORDER — DIPHENHYDRAMINE HCL 50 MG/ML IJ SOLN: 12.5000 mg | INTRAMUSCULAR | Status: DC | PRN

## 2013-12-08 MED ORDER — LACTATED RINGERS IV SOLN
500.0000 mL | Freq: Once | INTRAVENOUS | Status: AC
Start: 2013-12-08 — End: 2013-12-08
  Administered 2013-12-08: 500 mL via INTRAVENOUS

## 2013-12-08 MED ORDER — LACTATED RINGERS IV SOLN
INTRAVENOUS | Status: DC
  Administered 2013-12-09: 01:00:00 via INTRAVENOUS

## 2013-12-08 MED ORDER — ACETAMINOPHEN 325 MG PO TABS: 650.0000 mg | ORAL_TABLET | ORAL | Status: DC | PRN

## 2013-12-08 MED ORDER — OXYTOCIN BOLUS FROM INFUSION
500.0000 mL | INTRAVENOUS | Status: DC
  Administered 2013-12-09: 500 mL via INTRAVENOUS

## 2013-12-08 MED ORDER — PHENYLEPHRINE 40 MCG/ML (10ML) SYRINGE FOR IV PUSH (FOR BLOOD PRESSURE SUPPORT)
80.0000 ug | PREFILLED_SYRINGE | INTRAVENOUS | Status: DC | PRN
  Filled 2013-12-08: qty 2

## 2013-12-08 MED ORDER — LIDOCAINE HCL (PF) 1 % IJ SOLN
30.0000 mL | INTRAMUSCULAR | Status: DC | PRN
  Filled 2013-12-08: qty 30

## 2013-12-08 MED ORDER — FENTANYL CITRATE 0.05 MG/ML IJ SOLN: 100.0000 ug | INTRAMUSCULAR | Status: DC | PRN

## 2013-12-08 MED ORDER — PENICILLIN G POTASSIUM 5000000 UNITS IJ SOLR
5.0000 10*6.[IU] | Freq: Once | INTRAVENOUS | Status: AC
  Administered 2013-12-08: 5 10*6.[IU] via INTRAVENOUS
  Filled 2013-12-08: qty 5

## 2013-12-08 MED ORDER — PHENYLEPHRINE 40 MCG/ML (10ML) SYRINGE FOR IV PUSH (FOR BLOOD PRESSURE SUPPORT)
80.0000 ug | PREFILLED_SYRINGE | INTRAVENOUS | Status: DC | PRN
  Filled 2013-12-08: qty 2
  Filled 2013-12-08: qty 10

## 2013-12-08 MED ORDER — EPHEDRINE 5 MG/ML INJ
10.0000 mg | INTRAVENOUS | Status: DC | PRN
  Filled 2013-12-08: qty 2

## 2013-12-08 MED ORDER — OXYTOCIN 40 UNITS IN LACTATED RINGERS INFUSION - SIMPLE MED: 62.5000 mL/h | INTRAVENOUS | Status: DC

## 2013-12-08 MED ORDER — CITRIC ACID-SODIUM CITRATE 334-500 MG/5ML PO SOLN: 30.0000 mL | ORAL | Status: DC | PRN

## 2013-12-08 MED ORDER — FENTANYL 2.5 MCG/ML BUPIVACAINE 1/10 % EPIDURAL INFUSION (WH - ANES)
14.0000 mL/h | INTRAMUSCULAR | Status: DC | PRN
  Administered 2013-12-09: 14 mL/h via EPIDURAL
  Filled 2013-12-08 (×2): qty 125

## 2013-12-08 MED ORDER — PENICILLIN G POTASSIUM 5000000 UNITS IJ SOLR
2.5000 10*6.[IU] | INTRAVENOUS | Status: DC
  Administered 2013-12-09 (×2): 2.5 10*6.[IU] via INTRAVENOUS
  Filled 2013-12-08 (×4): qty 2.5

## 2013-12-08 MED ORDER — LACTATED RINGERS IV SOLN: 500.0000 mL | INTRAVENOUS | Status: DC | PRN

## 2013-12-08 NOTE — Progress Notes (Signed)
Notified of pt arrival in [facility], cervical exam and decelerations in [facility]HR. Provider reviewed strip. Will monitor longer and then come see pt

## 2013-12-08 NOTE — H&P (Signed)
LABOR ADMISSION HISTORY AND PHYSICAL  Sara Foster is a 18 y.o. female G1P0 with IUP at [redacted]w[redacted]d by [redacted]w[redacted]d sono presenting for contractions, noted to have recurrent deep variables in MAU with each contraction. She reports +FMs, No LOF, no VB, no blurry vision, headaches or peripheral edema, and RUQ pain. She desires an epidural for labor pain control. She plans on breast and bottle feeding. undecided for birth control.   Dating: By [redacted]w[redacted]d sono --->  Estimated Date of Delivery: 12/09/13  Prenatal History/Complications:  Past Medical History: Past Medical History  Diagnosis Date  . Migraines   . Gonorrhea 2015  . Chlamydia 2015    Past Surgical History: Past Surgical History  Procedure Laterality Date  . No past surgeries      Obstetrical History: OB History   Grav Para Term Preterm Abortions TAB SAB Ect Mult Living   1               Social History: History   Social History  . Marital Status: Single    Spouse Name: N/A    Number of Children: N/A  . Years of Education: N/A   Social History Main Topics  . Smoking status: Never Smoker   . Smokeless tobacco: Never Used  . Alcohol Use: No  . Drug Use: No  . Sexual Activity: Yes    Birth Control/ Protection: None   Other Topics Concern  . None   Social History Narrative  . None    Family History: Family History  Problem Relation Age of Onset  . Cancer    . Asthma Mother   . Lung disease Father   . Arthritis Maternal Grandmother   . Lung disease Maternal Grandfather   . Heart disease Paternal Grandfather     Allergies: No Known Allergies  Prescriptions prior to admission  Medication Sig Dispense Refill  . acyclovir (ZOVIRAX) 400 MG tablet Take 1 tablet (400 mg total) by mouth 3 (three) times daily.  90 tablet  3  . Prenatal Vit-Fe Fumarate-FA (PRENATAL MULTIVITAMIN) TABS tablet Take 1 tablet by mouth daily.         Review of Systems   All systems reviewed and negative except as stated in HPI  Blood  pressure 134/83, pulse 100, temperature 98.3 F (36.8 C), temperature source Oral, resp. rate 18, last menstrual period 03/13/2013. General appearance: alert, cooperative and moderate distress Lungs: clear to auscultation bilaterally Heart: regular rate and rhythm Abdomen: soft, non-tender; bowel sounds normal Extremities: Homans sign is negative, no sign of DVT DTR's not examined Presentation: cephalic Fetal monitoringBaseline: 135 bpm, Variability: Good {> 6 bpm), Accelerations: Reactive and Decelerations: Variable: recurrent down to 70 Uterine activity q71min  Dilation: 3.5 Effacement (%): 70 Station: -2 Exam by:: Sharen Hint RN   Prenatal labs: ABO, Rh: A/POS/-- (02/18 1515) Antibody: NEG (07/02 0900) Rubella:   RPR: NON REAC (07/02 0900)  HBsAg: NEGATIVE (02/18 1515)  HIV: NONREACTIVE (07/02 0900)  GBS: Detected (09/04 1211)  2 hr Glucola 67/100/100 Genetic screening  NT/IT: T21, T-18 risk <1:5000, ONTD <1:2800 Anatomy US normal  No results found for this or any previous visit (from the past 24 hour(s)).  Patient Active Problem List   Diagnosis Date Noted  . Neisseria gonorrheae 11/20/2013  . Pregnancy, supervision, high-risk 11/16/2013  . Supervision of low-risk pregnancy 11/16/2013  . HSV-2 infection complicating pregnancy 10/31/2013  . Encounter for supervision of normal first pregnancy in second trimester 09/13/2013  . Supervision of low-risk first pregnancy 06/29/2013  .  Rubella non-immune status, antepartum 05/03/2013  . Chlamydia infection complicating pregnancy in first trimester 05/03/2013  . Pregnant 05/02/2013  . Leg weakness 01/05/2011  . Neck muscle weakness 01/05/2011  . Scoliosis 12/31/2010    Assessment: Sara Foster is a 18 y.o. G1P0 at [redacted]w[redacted]d here for SOL with recurrent deep variables   #Labor: will augment if needed, AROM and place IUPC when able 2/2 cat II strip #Pain: Fentanyl prn, epidural upon request #FWB: Cat II, AROM when able  with IUPC for amnioinfusion - AFI to eval for oligo #ID:  GBS+ PCN - HSV IgG+: no hx of outbreaks ever #MOF: breast #MOC:undecided #Circ:  N/a, female   Brice Kossman ROCIO 12/08/2013, 10:43 PM

## 2013-12-08 NOTE — MAU Note (Signed)
Pt presents complaining of contractions every 5-10 minutes. States her membranes were stripped on Friday by provider and was 2cm. Reports she lost her mucous plug. Denies vaginal bleeding or leaking of fluid. Reports good fetal movement.

## 2013-12-08 NOTE — Progress Notes (Signed)
Notified of variable decelerations with each contraction. Will admit pt to labor and delivery

## 2013-12-08 NOTE — H&P (Signed)
Attestation of Attending Supervision of Fellow: Evaluation and management procedures were performed by the Fellow under my supervision and collaboration.  I have reviewed the Fellow's note and chart, and I agree with the management and plan.    

## 2013-12-09 ENCOUNTER — Encounter (HOSPITAL_COMMUNITY): Payer: Self-pay | Admitting: Anesthesiology

## 2013-12-09 ENCOUNTER — Inpatient Hospital Stay (HOSPITAL_COMMUNITY): Payer: Medicaid Other | Admitting: Anesthesiology

## 2013-12-09 ENCOUNTER — Encounter (HOSPITAL_COMMUNITY): Payer: Medicaid Other | Admitting: Anesthesiology

## 2013-12-09 LAB — COMPREHENSIVE METABOLIC PANEL
ALBUMIN: 2.5 g/dL — AB (ref 3.5–5.2)
ALK PHOS: 135 U/L — AB (ref 39–117)
ALT: 12 U/L (ref 0–35)
ANION GAP: 17 — AB (ref 5–15)
AST: 19 U/L (ref 0–37)
BILIRUBIN TOTAL: 0.2 mg/dL — AB (ref 0.3–1.2)
BUN: 7 mg/dL (ref 6–23)
CHLORIDE: 104 meq/L (ref 96–112)
CO2: 18 mEq/L — ABNORMAL LOW (ref 19–32)
Calcium: 8.6 mg/dL (ref 8.4–10.5)
Creatinine, Ser: 0.48 mg/dL — ABNORMAL LOW (ref 0.50–1.10)
GFR calc Af Amer: >90 mL/min (ref >90)
Glucose, Bld: 87 mg/dL (ref 70–99)
POTASSIUM: 3.5 meq/L — AB (ref 3.7–5.3)
Sodium: 139 mEq/L (ref 137–147)
Total Protein: 5.8 g/dL — ABNORMAL LOW (ref 6.0–8.3)

## 2013-12-09 LAB — PLATELET COUNT: Platelets: 199 10*3/uL (ref 150–400)

## 2013-12-09 LAB — CBC
HEMATOCRIT: 36 % (ref 36.0–46.0)
HEMOGLOBIN: 12.7 g/dL (ref 12.0–15.0)
MCH: 31 pg (ref 26.0–34.0)
MCHC: 35.3 g/dL (ref 30.0–36.0)
MCV: 87.8 fL (ref 78.0–100.0)
Platelets: 212 10*3/uL (ref 150–400)
RBC: 4.1 MIL/uL (ref 3.87–5.11)
RDW: 13.6 % (ref 11.5–15.5)
WBC: 13.9 10*3/uL — ABNORMAL HIGH (ref 4.0–10.5)

## 2013-12-09 LAB — PROTEIN / CREATININE RATIO, URINE
Creatinine, Urine: 24.38 mg/dL
Protein Creatinine Ratio: 0.16 — ABNORMAL HIGH (ref 0.00–0.15)
TOTAL PROTEIN, URINE: 4 mg/dL

## 2013-12-09 LAB — HIV ANTIBODY (ROUTINE TESTING W REFLEX): HIV 1&2 Ab, 4th Generation: NONREACTIVE

## 2013-12-09 LAB — SYPHILIS: RPR W/REFLEX TO RPR TITER AND TREPONEMAL ANTIBODIES, TRADITIONAL SCREENING AND DIAGNOSIS ALGORITHM

## 2013-12-09 MED ORDER — TETANUS-DIPHTH-ACELL PERTUSSIS 5-2.5-18.5 LF-MCG/0.5 IM SUSP
0.5000 mL | Freq: Once | INTRAMUSCULAR | Status: AC
  Administered 2013-12-10: 0.5 mL via INTRAMUSCULAR
  Filled 2013-12-09: qty 0.5

## 2013-12-09 MED ORDER — SIMETHICONE 80 MG PO CHEW: 80.0000 mg | CHEWABLE_TABLET | ORAL | Status: DC | PRN

## 2013-12-09 MED ORDER — SENNOSIDES-DOCUSATE SODIUM 8.6-50 MG PO TABS
2.0000 | ORAL_TABLET | ORAL | Status: DC
  Administered 2013-12-09 – 2013-12-11 (×2): 2 via ORAL
  Filled 2013-12-09: qty 23
  Filled 2013-12-09: qty 2

## 2013-12-09 MED ORDER — LANOLIN HYDROUS EX OINT: TOPICAL_OINTMENT | CUTANEOUS | Status: DC | PRN

## 2013-12-09 MED ORDER — LIDOCAINE HCL (PF) 1 % IJ SOLN
INTRAMUSCULAR | Status: DC | PRN
  Administered 2013-12-09 (×2): 8 mL

## 2013-12-09 MED ORDER — PRENATAL MULTIVITAMIN CH
1.0000 | ORAL_TABLET | Freq: Every day | ORAL | Status: DC
  Administered 2013-12-09 – 2013-12-11 (×3): 1 via ORAL
  Filled 2013-12-09 (×3): qty 1

## 2013-12-09 MED ORDER — TERBUTALINE SULFATE 1 MG/ML IJ SOLN: 0.2500 mg | Freq: Once | INTRAMUSCULAR | Status: DC | PRN

## 2013-12-09 MED ORDER — INFLUENZA VAC SPLIT QUAD 0.5 ML IM SUSY
0.5000 mL | PREFILLED_SYRINGE | INTRAMUSCULAR | Status: AC
  Administered 2013-12-10: 0.5 mL via INTRAMUSCULAR
  Filled 2013-12-09: qty 0.5

## 2013-12-09 MED ORDER — OXYCODONE-ACETAMINOPHEN 5-325 MG PO TABS: 2.0000 | ORAL_TABLET | ORAL | Status: DC | PRN

## 2013-12-09 MED ORDER — ZOLPIDEM TARTRATE 5 MG PO TABS: 5.0000 mg | ORAL_TABLET | Freq: Every evening | ORAL | Status: DC | PRN

## 2013-12-09 MED ORDER — OXYCODONE-ACETAMINOPHEN 5-325 MG PO TABS
1.0000 | ORAL_TABLET | ORAL | Status: DC | PRN
  Administered 2013-12-09: 1 via ORAL
  Filled 2013-12-09: qty 1

## 2013-12-09 MED ORDER — IBUPROFEN 600 MG PO TABS
600.0000 mg | ORAL_TABLET | Freq: Four times a day (QID) | ORAL | Status: DC
  Administered 2013-12-09 – 2013-12-11 (×9): 600 mg via ORAL
  Filled 2013-12-09 (×10): qty 1

## 2013-12-09 MED ORDER — ONDANSETRON HCL 4 MG/2ML IJ SOLN: 4.0000 mg | INTRAMUSCULAR | Status: DC | PRN

## 2013-12-09 MED ORDER — OXYTOCIN 40 UNITS IN LACTATED RINGERS INFUSION - SIMPLE MED
1.0000 m[IU]/min | INTRAVENOUS | Status: DC
  Administered 2013-12-09: 2 m[IU]/min via INTRAVENOUS
  Filled 2013-12-09: qty 1000

## 2013-12-09 MED ORDER — BENZOCAINE-MENTHOL 20-0.5 % EX AERO
1.0000 "application " | INHALATION_SPRAY | CUTANEOUS | Status: DC | PRN
  Administered 2013-12-09: 1 via TOPICAL
  Filled 2013-12-09: qty 56

## 2013-12-09 MED ORDER — DIBUCAINE 1 % RE OINT: 1.0000 "application " | TOPICAL_OINTMENT | RECTAL | Status: DC | PRN

## 2013-12-09 MED ORDER — DIPHENHYDRAMINE HCL 25 MG PO CAPS: 25.0000 mg | ORAL_CAPSULE | Freq: Four times a day (QID) | ORAL | Status: DC | PRN

## 2013-12-09 MED ORDER — FENTANYL 2.5 MCG/ML BUPIVACAINE 1/10 % EPIDURAL INFUSION (WH - ANES)
INTRAMUSCULAR | Status: DC | PRN
  Administered 2013-12-09: 14 mL/h via EPIDURAL

## 2013-12-09 MED ORDER — WITCH HAZEL-GLYCERIN EX PADS: 1.0000 "application " | MEDICATED_PAD | CUTANEOUS | Status: DC | PRN

## 2013-12-09 MED ORDER — ONDANSETRON HCL 4 MG PO TABS: 4.0000 mg | ORAL_TABLET | ORAL | Status: DC | PRN

## 2013-12-09 NOTE — Progress Notes (Signed)
LABOR PROGRESS NOTE  Sara Foster is a 18 y.o. G1P0 at [redacted]w[redacted]d  admitted for augmentation of early latent labor 2/2 recurrent variable decels that have subsequently improved  Subjective: Comfortable with epidural  Objective: BP 131/67  Pulse 93  Temp(Src) 98.2 F (36.8 C) (Oral)  Resp 18  Ht  (1.6 m)  Wt 158 lb (71.668 kg)  BMI 28.00 kg/m2  SpO2 99%  LMP 03/13/2013 or  Filed Vitals:   12/09/13 0231 12/09/13 0301 12/09/13 0331 12/09/13 0400  BP: 128/70 122/62 118/69 131/67  Pulse: 84 77 97 93  Temp:      TempSrc:      Resp:      Height:      Weight:      SpO2:           FHT:  FHR: 130 bpm, variability: moderate,  accelerations:  Present,  decelerations:  Absent UC:   q60min SVE:   Dilation: 5 Effacement (%): 90 Station: -2 Exam by:: Dr. Loreta Ave  Dilation: 5 Effacement (%): 90 Cervical Position: Middle Station: -2 Presentation: Vertex Exam by:: Dr. Loreta Ave  Pitocin @ 12 mu/min  Labs: Lab Results  Component Value Date   WBC 13.9* 12/08/2013   HGB 12.7 12/08/2013   HCT 36.0 12/08/2013   MCV 87.8 12/08/2013   PLT 199 12/09/2013    Assessment / Plan: Augmentation of labor, progressing well  Labor: Progressing normally, AROM this check with blood tinged fluid Fetal Wellbeing:  Category I Pain Control:  Epidural Anticipated MOD:  NSVD  Sara Foster ROCIO, MD 12/09/2013, 4:04 AM

## 2013-12-09 NOTE — Anesthesia Postprocedure Evaluation (Signed)
  Anesthesia Post-op Note  Patient: Sara Foster  Procedure(s) Performed: * No procedures listed *  Patient Location: Mother/Baby  Anesthesia Type:Epidural  Level of Consciousness: awake  Airway and Oxygen Therapy: Patient Spontanous Breathing  Post-op Pain: mild  Post-op Assessment: Patient's Cardiovascular Status Stable and Respiratory Function Stable  Post-op Vital Signs: stable  Last Vitals:  Filed Vitals:   12/09/13 1223  BP: 114/70  Pulse: 97  Temp: 36.7 C  Resp:     Complications: No apparent anesthesia complications

## 2013-12-09 NOTE — Anesthesia Preprocedure Evaluation (Signed)
Anesthesia Evaluation  Patient identified by MRN, date of birth, ID band Patient awake    Reviewed: Allergy & Precautions, H&P , NPO status , Patient's Chart, lab work & pertinent test results  Airway Mallampati: I  TM Distance: >3 FB Neck ROM: full    Dental no notable dental hx.    Pulmonary neg pulmonary ROS,    Pulmonary exam normal       Cardiovascular negative cardio ROS      Neuro/Psych negative psych ROS   GI/Hepatic negative GI ROS, Neg liver ROS,   Endo/Other  negative endocrine ROS  Renal/GU negative Renal ROS     Musculoskeletal   Abdominal Normal abdominal exam  (+)   Peds  Hematology negative hematology ROS (+)   Anesthesia Other Findings   Reproductive/Obstetrics (+) Pregnancy                             Anesthesia Physical Anesthesia Plan  ASA: II  Anesthesia Plan: Epidural   Post-op Pain Management:    Induction:   Airway Management Planned:   Additional Equipment:   Intra-op Plan:   Post-operative Plan:   Informed Consent: I have reviewed the patients History and Physical, chart, labs and discussed the procedure including the risks, benefits and alternatives for the proposed anesthesia with the patient or authorized representative who has indicated his/her understanding and acceptance.     Plan Discussed with:   Anesthesia Plan Comments:         Anesthesia Quick Evaluation  

## 2013-12-09 NOTE — Plan of Care (Signed)
Problem: Consults Goal: Birthing Suites Patient Information Press F2 to bring up selections list Outcome: Completed/Met Date Met:  12/09/13  Pt 37-[redacted] weeks EGA

## 2013-12-09 NOTE — Anesthesia Procedure Notes (Signed)
Epidural Patient location during procedure: OB Start time: 12/09/2013 12:24 AM End time: 12/09/2013 12:28 AM  Staffing Anesthesiologist: Leilani Able Performed by: anesthesiologist   Preanesthetic Checklist Completed: patient identified, surgical consent, pre-op evaluation, timeout performed, IV checked, risks and benefits discussed and monitors and equipment checked  Epidural Patient position: sitting Prep: site prepped and draped and DuraPrep Patient monitoring: continuous pulse ox and blood pressure Approach: midline Location: L3-L4 Injection technique: LOR air  Needle:  Needle type: Tuohy  Needle gauge: 17 G Needle length: 9 cm and 9 Needle insertion depth: 6 cm Catheter type: closed end flexible Catheter size: 19 Gauge Catheter at skin depth: 11 cm Test dose: negative and Other  Assessment Sensory level: T9 Events: blood not aspirated, injection not painful, no injection resistance, negative IV test and no paresthesia  Additional Notes Reason for block:procedure for pain

## 2013-12-09 NOTE — Progress Notes (Signed)
Patient given tdap, mmr, and influenza vis.

## 2013-12-09 NOTE — Lactation Note (Signed)
This note was copied from the chart of Sara Foster. Lactation Consultation Note  Patient Name: Sara Ariany Kesselman ZOXWR'U Date: 12/09/2013 Reason for consult: Initial assessment of this mom and baby at 12 hours postpartum.  Mom is primipara and reports being able to easily express colostrum and that baby fed for >40 minutes at first attempt after birth but has been sleepy at other attempts.  Baby is term AGA and LC discussed the normal newborn sleepiness during baby's first 24 hours.  Both LC and RN have encouraged frequent STS and cue feedings. Mom encouraged to feed baby 8-12 times/24 hours and with feeding cues. LC encouraged review of Baby and Me pp 9, 14 and 20-25 for STS and BF information. LC provided [employer] brochure and reviewed [facility] services and list of community and web site resources.     Maternal Data Formula Feeding for Exclusion: No Has patient been taught Hand Expression?: Yes (mom states she has been able to express her colostrum easily) Does the patient have breastfeeding experience prior to this delivery?: No  Feeding Feeding Type: Breast Fed  LATCH Score/Interventions           LATCH score=8 at first feeding per RN assessment           Lactation Tools Discussed/Used   STS, cue feedings, hand expression Normal newborn sleepiness  Consult Status Consult Status: Follow-up Date: 12/10/13 Follow-up type: In-patient    Warrick Parisian [facility] 12/09/2013, 10:06 PM

## 2013-12-10 MED ORDER — MEASLES, MUMPS & RUBELLA VAC ~~LOC~~ INJ
0.5000 mL | INJECTION | Freq: Once | SUBCUTANEOUS | Status: AC
  Administered 2013-12-11: 0.5 mL via SUBCUTANEOUS
  Filled 2013-12-10: qty 0.5

## 2013-12-10 NOTE — Progress Notes (Signed)
Ur chart review completed.  

## 2013-12-10 NOTE — Progress Notes (Signed)
Post Partum Day 1 Subjective: no complaints, up ad lib, voiding, tolerating PO, + flatus and (-) BM, vaginal pain 3/10 alleviated with motrin  Objective: Blood pressure 107/61, pulse 91, temperature 98.8 F (37.1 C), temperature source Oral, resp. rate 18, height  (1.6 m), weight 71.668 kg (158 lb), last menstrual period 03/13/2013, SpO2 99.00%, unknown if currently breastfeeding.  Physical Exam:  General: alert, cooperative, appears stated age and no distress Lochia: appropriate Uterine Fundus: firm DVT Evaluation: No evidence of DVT seen on physical exam. Negative Homan's sign. No cords or calf tenderness.   Recent Labs  12/08/13 2250  HGB 12.7  HCT 36.0    Assessment/Plan:   DC tomorrow vs Wednesday  Still unsure about contraception  Breast feeding  Pain adequately controlled  Encouraged ambulation   LOS: 2 days   Rosemary Holms 12/10/2013, 7:51 AM

## 2013-12-10 NOTE — Lactation Note (Signed)
This note was copied from the chart of Sara Jamaiyah Pyle. Lactation Consultation Note  Mother has sore nipples, pink and flat.  Provided her with a hand pump to help evert nipples. Assisted mother with football hold.  Baby would not latch, sleepy at breast. Mother hand pumped great flow of colostrum.  Gave baby spoonful 2.5 ml of colostrum and baby started cueing.  Encouraged parents to wake baby to breastfeed if showing no feeding cues. Baby latched but mother was in a lot of pain.  Introduced a #24NS.  #20NS was uncomfortable. Baby relatched with NS and mother states she has improved comfort pushing on shoulders also instead of baby's head. Rhythmical sucks and swallows observed. Demonstrated how to prefill NS with breastmilk with syringe and use foley cup. Mother has comfort gels.  Reviewed apply ebm to nipples for healing. Patient Name: Sara Foster  ZOXWR'U Date: 12/10/2013 Reason for consult: Follow-up assessment   Maternal Data    Feeding Feeding Type: Breast Fed (#24NS)  LATCH Score/Interventions Latch: Grasps breast easily, tongue down, lips flanged, rhythmical sucking.  Audible Swallowing: Spontaneous and intermittent  Type of Nipple: Flat  Comfort (Breast/Nipple): Filling, red/small blisters or bruises, mild/mod discomfort  Problem noted: Mild/Moderate discomfort Interventions (Mild/moderate discomfort): Hand expression;Comfort gels;Post-pump;Pre-pump if needed  Hold (Positioning): Assistance needed to correctly position infant at breast and maintain latch.  LATCH Score: 7  Lactation Tools Discussed/Used     Consult Status Consult Status: Follow-up Date: 12/11/13 Follow-up type: In-patient    Dahlia Byes [facility] 12/10/2013, 1:30 PM

## 2013-12-10 NOTE — Progress Notes (Signed)
I examined pt and agree with documentation above and resident plan of care. Walidah N Karim, CNM  

## 2013-12-11 MED ORDER — IBUPROFEN 600 MG PO TABS: 600.0000 mg | ORAL_TABLET | Freq: Four times a day (QID) | ORAL | Status: DC | PRN

## 2013-12-11 NOTE — Discharge Summary (Signed)
OB fellow attestation Post Partum Day 2 I have seen and examined this patient and agree with above documentation in the resident's note.   Sara Foster is a 18 y.o. G1P1001 s/p NSVD.  Pt denies problems with ambulating, voiding or po intake. Pain is well controlled.  Plan for birth control is Depo vs Mirena.  Method of Feeding: Breast  PE:  BP 113/67  Pulse 103  Temp(Src) 98.1 F (36.7 C) (Oral)  Resp 18  Ht 5\' 3"  (1.6 m)  Wt 158 lb (71.668 kg)  BMI 28.00 kg/m2  SpO2 99%  LMP 03/13/2013  Breastfeeding? Unknown Fundus firm  Plan for discharge: Today  Elita Booneoberts, Kree Rafter C, MD 8:59 AM

## 2013-12-11 NOTE — Lactation Note (Signed)
This note was copied from the chart of Sara Foster. Lactation Consultation Note  Patient Name: Sara Foster ZOXWR'UToday's Date: 12/11/2013 Reason for consult: Follow-up assessment;Breast/nipple pain Mom is now latching baby without nipple shield. She reports her nipple pain has improved. She is wearing her comfort gels. Mom latched baby at this visit. LC reviewed good positioning and back support. Baby sleepy but demonstrates a good rhythmic suck with audible swallows with stimulation. Mom reports some mild discomfort with initial latch that resolves as the baby nurses. Mom's breast starting to fill, encouraged to pre-pump to help baby obtain good depth w/latch. Basic teaching reviewed. Engorgement care reviewed if needed. Advised of OP services and support group. Encouraged Mom to call for questions/concerns.   Maternal Data    Feeding Feeding Type: Breast Fed Length of feed: 20 min (per Mom)  LATCH Score/Interventions Latch: Grasps breast easily, tongue down, lips flanged, rhythmical sucking.  Audible Swallowing: Spontaneous and intermittent  Type of Nipple: Everted at rest and after stimulation (dimpling center of nipple, short shaft)  Comfort (Breast/Nipple): Filling, red/small blisters or bruises, mild/mod discomfort  Problem noted: Mild/Moderate discomfort;Filling Interventions  (Cracked/bleeding/bruising/blister): Expressed breast milk to nipple;Other (comment) (comfort gels) Interventions (Mild/moderate discomfort): Comfort gels;Pre-pump if needed  Hold (Positioning): Assistance needed to correctly position infant at breast and maintain latch.  LATCH Score: 8  Lactation Tools Discussed/Used Tools: Comfort gels;Pump Breast pump type: Manual   Consult Status Consult Status: Complete Date: 12/11/13 Follow-up type: In-patient    Alfred LevinsGranger, [name] Ann 12/11/2013, 9:56 AM

## 2013-12-11 NOTE — Discharge Instructions (Signed)

## 2013-12-11 NOTE — Discharge Summary (Signed)
Obstetric Discharge Summary Reason for Admission: onset of labor and recurrent deep variables Prenatal Procedures: none Intrapartum Procedures: spontaneous vaginal delivery and GBS prophylaxis Postpartum Procedures: none Complications-Operative and Postpartum: labial laceration Hemoglobin  Date Value Ref Range Status  12/08/2013 12.7  12.0 - 15.0 g/dL Final     HCT  Date Value Ref Range Status  12/08/2013 36.0  36.0 - 46.0 % Final    Discharge Diagnoses: Term Pregnancy-delivered  Hospital Course:  Sara Foster is a 18 y.o. G1P1001 who presented with SOL with deep repetitive variables. Of note, the patient has a h/o chlamydia and gonorrhea with no test of cure. Patient states she was treated and has not had sexual intercourse since the diagnosis. She had a uncomplicated SVD. She was able to ambulate, tolerate PO and void normally. She was discharged home with instructions for postpartum care.    Delivery Note At 9:11 AM a viable and healthy female was delivered via Vaginal, Spontaneous Delivery (Presentation: Right Occiput Anterior). APGAR: 9, 9; weight .  Placenta status: Intact, Spontaneous. Cord: 3 vessels with the following complications: None.   Anesthesia: Epidural  Episiotomy: None  Lacerations: Labial  Suture Repair: 4-0 monocryl  Est. Blood Loss (mL): 350   Mom to postpartum. Baby to Couplet care / Skin to Skin.   Physical Exam:  General: alert, cooperative and no distress Lochia: appropriate Uterine Fundus: firm DVT Evaluation: No evidence of DVT seen on physical exam. Negative Homan's sign. No cords or calf tenderness. No significant calf/ankle edema.  Discharge Information: Date: 12/11/2013 Activity: pelvic rest Diet: routine Medications: PNV and Ibuprofen Baby feeding: plans to breastfeed, plans to bottle feed Contraception: Depo vs Mirena Condition: stable Instructions: refer to practice specific booklet Discharge to: home   Newborn Data: Live  born female  Birth Weight: 7 lb 6.3 oz (3355 g) APGAR: 9, 9  Home with mother.  Joanna Puffrystal S Dorsey, MD [facility] PGY-1 12/11/2013, 7:10 AM

## 2013-12-13 ENCOUNTER — Telehealth: Payer: Self-pay | Admitting: Obstetrics & Gynecology

## 2013-12-13 NOTE — Telephone Encounter (Signed)
Pt states has large painful bumps under bilateral arms. Pt is breastfeeding. Call transferred to front staff for next available appt.

## 2013-12-14 ENCOUNTER — Ambulatory Visit (INDEPENDENT_AMBULATORY_CARE_PROVIDER_SITE_OTHER): Payer: Medicaid Other | Admitting: Obstetrics and Gynecology

## 2013-12-14 ENCOUNTER — Encounter: Payer: Self-pay | Admitting: Obstetrics and Gynecology

## 2013-12-14 ENCOUNTER — Encounter: Payer: Medicaid Other | Admitting: Obstetrics & Gynecology

## 2013-12-14 VITALS — BP 122/80 | Temp 98.8°F | Ht 63.0 in | Wt 139.2 lb

## 2013-12-14 DIAGNOSIS — O9279 Other disorders of lactation: Secondary | ICD-10-CM

## 2013-12-14 DIAGNOSIS — Q831 Accessory breast: Secondary | ICD-10-CM | POA: Insufficient documentation

## 2013-12-14 NOTE — Progress Notes (Signed)
This chart was scribed by Leone PayorSonum Patel, Medical Scribe, for Dr. Christin BachJohn Graziella Connery on 12/14/13 at 11:47 AM. This chart was reviewed by Dr. Christin BachJohn Mariel Gaudin for accuracy.   [facility] Visit  Patient name: Sara Foster MRN [id number]  Date of birth: [birth year]  CC & HPI:  Sara Foster is a 18 y.o. female presenting today for a lump under each axilla. Patient states she delivered 5 days ago and is breast feeding.   ROS:  +lump to each axilla   Pertinent History Reviewed:   Reviewed: Significant for  Medical         Past Medical History  Diagnosis Date  . Migraines   . Gonorrhea 2015  . Chlamydia 2015                              Surgical Hx:    Past Surgical History  Procedure Laterality Date  . No past surgeries     Medications: Reviewed & Updated - see associated section                      Current outpatient prescriptions:acyclovir (ZOVIRAX) 400 MG tablet, Take 1 tablet (400 mg total) by mouth 3 (three) times daily., Disp: 90 tablet, Rfl: 3;  ibuprofen (ADVIL,MOTRIN) 600 MG tablet, Take 1 tablet (600 mg total) by mouth every 6 (six) hours as needed for cramping., Disp: 30 tablet, Rfl: 0;  Prenatal Vit-Fe Fumarate-FA (PRENATAL MULTIVITAMIN) TABS tablet, Take 1 tablet by mouth daily., Disp: , Rfl:  [DISCONTINUED] PARoxetine (PAXIL) 40 MG tablet, Take 40 mg by mouth every morning. , Disp: , Rfl:    Social History: Reviewed -  reports that she has never smoked. She has never used smokeless tobacco.  Objective Findings:  Vitals: Blood pressure 122/80, temperature 98.8 F (37.1 C), height 5\' 3"  (1.6 m), weight 139 lb 3.2 oz (63.141 kg), currently breastfeeding.  Physical Examination: General appearance - alert, well appearing, and in no distress and oriented to person, place, and time Mental status - alert, oriented to person, place, and time, normal mood, behavior, speech, dress, motor activity, and thought processes Breasts - normal lactation bilaterally. Axillary 8 cm  area of symmetric swelling consistent with accessory breast tissue.    Assessment & Plan:   A:  1. Axillary accessory breast tissue, lactating  P:  1. Follow up PRN, as normal post partum.

## 2013-12-19 ENCOUNTER — Ambulatory Visit: Payer: Medicaid Other | Admitting: Obstetrics and Gynecology

## 2014-01-14 ENCOUNTER — Encounter: Payer: Self-pay | Admitting: Obstetrics and Gynecology

## 2014-01-21 ENCOUNTER — Encounter: Payer: Self-pay | Admitting: Women's Health

## 2014-01-21 ENCOUNTER — Ambulatory Visit (INDEPENDENT_AMBULATORY_CARE_PROVIDER_SITE_OTHER): Payer: Medicaid Other | Admitting: Women's Health

## 2014-01-21 DIAGNOSIS — A749 Chlamydial infection, unspecified: Secondary | ICD-10-CM

## 2014-01-21 DIAGNOSIS — A549 Gonococcal infection, unspecified: Secondary | ICD-10-CM

## 2014-01-21 DIAGNOSIS — Z3009 Encounter for other general counseling and advice on contraception: Secondary | ICD-10-CM

## 2014-01-21 NOTE — Patient Instructions (Signed)
NO SEX UNTIL IUD PUT IN  Levonorgestrel intrauterine device (IUD)- Liletta What is this medicine? LEVONORGESTREL IUD (LEE voe nor jes trel) is a contraceptive (birth control) device. The device is placed inside the uterus by a healthcare professional. It is used to prevent pregnancy and can also be used to treat heavy bleeding that occurs during your period. Depending on the device, it can be used for 3 to 5 years. This medicine may be used for other purposes; ask your health care provider or pharmacist if you have questions. COMMON BRAND NAME(S): Elveria RoyalsLILETTA, Mirena, Skyla What should I tell my health care provider before I take this medicine? They need to know if you have any of these conditions: -abnormal Pap smear -cancer of the breast, uterus, or cervix -diabetes -endometritis -genital or pelvic infection now or in the past -have more than one sexual partner or your partner has more than one partner -heart disease -history of an ectopic or tubal pregnancy -immune system problems -IUD in place -liver disease or tumor -problems with blood clots or take blood-thinners -use intravenous drugs -uterus of unusual shape -vaginal bleeding that has not been explained -an unusual or allergic reaction to levonorgestrel, other hormones, silicone, or polyethylene, medicines, foods, dyes, or preservatives -pregnant or trying to get pregnant -breast-feeding How should I use this medicine? This device is placed inside the uterus by a health care professional. Talk to your pediatrician regarding the use of this medicine in children. Special care may be needed. Overdosage: If you think you have taken too much of this medicine contact a poison control center or emergency room at once. NOTE: This medicine is only for you. Do not share this medicine with others. What if I miss a dose? This does not apply. What may interact with this medicine? Do not take this medicine with any of the following  medications: -amprenavir -bosentan -fosamprenavir This medicine may also interact with the following medications: -aprepitant -barbiturate medicines for inducing sleep or treating seizures -bexarotene -griseofulvin -medicines to treat seizures like carbamazepine, ethotoin, felbamate, oxcarbazepine, phenytoin, topiramate -modafinil -pioglitazone -rifabutin -rifampin -rifapentine -some medicines to treat HIV infection like atazanavir, indinavir, lopinavir, nelfinavir, tipranavir, ritonavir -St. John's wort -warfarin This list may not describe all possible interactions. Give your health care provider a list of all the medicines, herbs, non-prescription drugs, or dietary supplements you use. Also tell them if you smoke, drink alcohol, or use illegal drugs. Some items may interact with your medicine. What should I watch for while using this medicine? Visit your doctor or health care professional for regular check ups. See your doctor if you or your partner has sexual contact with others, becomes HIV positive, or gets a sexual transmitted disease. This product does not protect you against HIV infection (AIDS) or other sexually transmitted diseases. You can check the placement of the IUD yourself by reaching up to the top of your vagina with clean fingers to feel the threads. Do not pull on the threads. It is a good habit to check placement after each menstrual period. Call your doctor right away if you feel more of the IUD than just the threads or if you cannot feel the threads at all. The IUD may come out by itself. You may become pregnant if the device comes out. If you notice that the IUD has come out use a backup birth control method like condoms and call your health care provider. Using tampons will not change the position of the IUD and are okay to  use during your period. What side effects may I notice from receiving this medicine? Side effects that you should report to your doctor or  health care professional as soon as possible: -allergic reactions like skin rash, itching or hives, swelling of the face, lips, or tongue -fever, flu-like symptoms -genital sores -high blood pressure -no menstrual period for 6 weeks during use -pain, swelling, warmth in the leg -pelvic pain or tenderness -severe or sudden headache -signs of pregnancy -stomach cramping -sudden shortness of breath -trouble with balance, talking, or walking -unusual vaginal bleeding, discharge -yellowing of the eyes or skin Side effects that usually do not require medical attention (report to your doctor or health care professional if they continue or are bothersome): -acne -breast pain -change in sex drive or performance -changes in weight -cramping, dizziness, or faintness while the device is being inserted -headache -irregular menstrual bleeding within first 3 to 6 months of use -nausea This list may not describe all possible side effects. Call your doctor for medical advice about side effects. You may report side effects to FDA at 1-800-FDA-1088. Where should I keep my medicine? This does not apply. NOTE: This sheet is a summary. It may not cover all possible information. If you have questions about this medicine, talk to your doctor, pharmacist, or health care provider.  2015, Elsevier/Gold Standard. (2011-04-01 13:54:04)

## 2014-01-21 NOTE — Progress Notes (Signed)
Patient ID: Sara Foster, female   DOB: [birth year], 18 y.o.   MRN: [id number] Subjective:    Sara BowieCassidy D Trapp is a 18 y.o. [id number] Caucasian female who presents for a postpartum visit. She is 5 weeks postpartum following a spontaneous vaginal delivery at 40.0 gestational weeks. Anesthesia: epidural. I have fully reviewed the prenatal and intrapartum course. Postpartum course has been uncomplicated. Baby's course has been uncomplicated. Baby is feeding by breast x 2wks, now bottle. Bleeding started period 11/6. Bowel function is normal. Bladder function is normal. Patient is sexually active. Last sexual activity: 10/30. Contraception method is condoms and desires IUD. Postpartum depression screening: negative. Score 0.  Last pap <21yo. GC/CT were both + on 9/4, was tx, and no POC since.   The following portions of the patient's history were reviewed and updated as appropriate: allergies, current medications, past medical history, past surgical history and problem list.  Review of Systems Pertinent items are noted in HPI.   Filed Vitals:   01/21/14 1100  BP: 124/76  Height: 5\' 3"  (1.6 m)  Weight: 134 lb (60.782 kg)   Patient's last menstrual period was 01/18/2014.  Objective:   General:  alert, cooperative and no distress   Breasts:  deferred, no complaints  Lungs: clear to auscultation bilaterally  Heart:  regular rate and rhythm  Abdomen: soft, nontender   Vulva: normal  Vagina: normal vagina  Cervix:  closed  Corpus: Well-involuted  Adnexa:  Non-palpable  Rectal Exam: No hemorrhoids        Assessment:   Postpartum exam 5 wks s/p SVB Bottlefeeding Depression screening Contraception counseling  +GC/CT 11/16/13  Plan:  GC/CT from urine today Contraception: abstinence and Liletta placement in 1wk as long as gc/ct neg Follow up in: 1 week for UPT & Liletta placement or earlier if needed Pap @ 19yo   Marge DuncansBooker, Niamya Vittitow Randall CNM, [facility]NP[facility]BC 01/21/2014 11:26 AM

## 2014-01-22 ENCOUNTER — Other Ambulatory Visit: Payer: Self-pay | Admitting: Women's Health

## 2014-01-22 ENCOUNTER — Encounter: Payer: Self-pay | Admitting: Adult Health

## 2014-01-22 ENCOUNTER — Other Ambulatory Visit: Payer: Medicaid Other

## 2014-01-22 ENCOUNTER — Ambulatory Visit (INDEPENDENT_AMBULATORY_CARE_PROVIDER_SITE_OTHER): Payer: Medicaid Other | Admitting: Adult Health

## 2014-01-22 ENCOUNTER — Telehealth: Payer: Self-pay | Admitting: Women's Health

## 2014-01-22 DIAGNOSIS — Z3042 Encounter for surveillance of injectable contraceptive: Secondary | ICD-10-CM

## 2014-01-22 DIAGNOSIS — Z3202 Encounter for pregnancy test, result negative: Secondary | ICD-10-CM

## 2014-01-22 LAB — GC/CHLAMYDIA PROBE AMP
CT Probe RNA: NEGATIVE
GC PROBE AMP APTIMA: NEGATIVE

## 2014-01-22 LAB — HCG, QUANTITATIVE, PREGNANCY

## 2014-01-22 MED ORDER — MEDROXYPROGESTERONE ACETATE 150 MG/ML IM SUSP
150.0000 mg | Freq: Once | INTRAMUSCULAR | Status: AC
  Administered 2014-01-22: 150 mg via INTRAMUSCULAR

## 2014-01-22 MED ORDER — MEDROXYPROGESTERONE ACETATE 150 MG/ML IM SUSP: 150.0000 mg | INTRAMUSCULAR | Status: DC

## 2014-01-22 NOTE — Telephone Encounter (Signed)
Notified pt of - gc/ct. Reviewed contraindication for iud placement including recent (w/in 3mth) gc/ct. +gc/ct was 9/4, so will be eligible for iud on 12/4. Wants to do 1 depo shot to bridge her to time she can get iud. Last sex 10/30, will come in today for bhcg, if neg will send in rx for depo and she can come back this afternoon for injection. Then plan for Liletta IUD before would be time for next depo.  Transferred to front to schedule.  Cheral MarkerKimberly R. Matilde Markie, CNM, [facility]NP[facility] 01/22/2014 9:52 AM  .

## 2014-01-28 ENCOUNTER — Ambulatory Visit: Payer: Medicaid Other | Admitting: Women's Health

## 2014-04-01 ENCOUNTER — Ambulatory Visit: Payer: Medicaid Other | Admitting: Women's Health

## 2014-04-12 ENCOUNTER — Other Ambulatory Visit: Payer: Medicaid Other | Admitting: Adult Health

## 2014-04-12 ENCOUNTER — Encounter: Payer: Self-pay | Admitting: Adult Health

## 2014-04-16 ENCOUNTER — Encounter: Payer: Self-pay | Admitting: Obstetrics & Gynecology

## 2014-04-16 ENCOUNTER — Ambulatory Visit: Payer: Medicaid Other

## 2014-04-18 ENCOUNTER — Ambulatory Visit (INDEPENDENT_AMBULATORY_CARE_PROVIDER_SITE_OTHER): Payer: Medicaid Other | Admitting: *Deleted

## 2014-04-18 ENCOUNTER — Encounter: Payer: Self-pay | Admitting: *Deleted

## 2014-04-18 DIAGNOSIS — Z3042 Encounter for surveillance of injectable contraceptive: Secondary | ICD-10-CM

## 2014-04-18 DIAGNOSIS — Z3202 Encounter for pregnancy test, result negative: Secondary | ICD-10-CM

## 2014-04-18 LAB — POCT URINE PREGNANCY: Preg Test, Ur: NEGATIVE

## 2014-04-18 MED ORDER — MEDROXYPROGESTERONE ACETATE 150 MG/ML IM SUSP
150.0000 mg | Freq: Once | INTRAMUSCULAR | Status: AC
  Administered 2014-04-18: 150 mg via INTRAMUSCULAR

## 2014-04-18 NOTE — Progress Notes (Signed)
Pt here for Depo. Pt reports needing a pap smear and wonders if she has an overactive bladder. Pt to schedule an appt for Pap and bladder issue. To return in 12 weeks for next shot. JSY

## 2014-04-24 ENCOUNTER — Ambulatory Visit: Payer: Medicaid Other | Admitting: Women's Health

## 2014-05-06 ENCOUNTER — Ambulatory Visit (INDEPENDENT_AMBULATORY_CARE_PROVIDER_SITE_OTHER): Payer: Medicaid Other | Admitting: Adult Health

## 2014-05-06 ENCOUNTER — Encounter: Payer: Self-pay | Admitting: Adult Health

## 2014-05-06 ENCOUNTER — Other Ambulatory Visit (HOSPITAL_COMMUNITY)
Admission: RE | Admit: 2014-05-06 | Discharge: 2014-05-06 | Disposition: A | Discharge status: Home / Self Care | Payer: Medicaid Other | Source: Ambulatory Visit | Attending: Adult Health | Admitting: Adult Health

## 2014-05-06 VITALS — BP 118/60 | HR 76 | Ht 63.25 in | Wt 138.0 lb

## 2014-05-06 DIAGNOSIS — Z113 Encounter for screening for infections with a predominantly sexual mode of transmission: Secondary | ICD-10-CM | POA: Diagnosis present

## 2014-05-06 DIAGNOSIS — N3281 Overactive bladder: Secondary | ICD-10-CM

## 2014-05-06 DIAGNOSIS — Z0000 Encounter for general adult medical examination without abnormal findings: Secondary | ICD-10-CM

## 2014-05-06 DIAGNOSIS — Z01411 Encounter for gynecological examination (general) (routine) with abnormal findings: Secondary | ICD-10-CM | POA: Insufficient documentation

## 2014-05-06 DIAGNOSIS — Z1389 Encounter for screening for other disorder: Secondary | ICD-10-CM

## 2014-05-06 DIAGNOSIS — Z01419 Encounter for gynecological examination (general) (routine) without abnormal findings: Secondary | ICD-10-CM

## 2014-05-06 DIAGNOSIS — Z3042 Encounter for surveillance of injectable contraceptive: Secondary | ICD-10-CM

## 2014-05-06 HISTORY — DX: Overactive bladder: N32.81

## 2014-05-06 LAB — POCT URINALYSIS DIPSTICK
Blood, UA: NEGATIVE
Glucose, UA: NEGATIVE
Leukocytes, UA: NEGATIVE
Nitrite, UA: NEGATIVE
Protein, UA: NEGATIVE

## 2014-05-06 MED ORDER — SOLIFENACIN SUCCINATE 5 MG PO TABS: 5.0000 mg | ORAL_TABLET | Freq: Every day | ORAL | Status: DC

## 2014-05-06 MED ORDER — MEDROXYPROGESTERONE ACETATE 150 MG/ML IM SUSP: 150.0000 mg | INTRAMUSCULAR | Status: DC

## 2014-05-06 NOTE — Patient Instructions (Signed)
Take vesicare 1 daily Physical in  1 year Decrease caffeine esp at bedtime

## 2014-05-06 NOTE — Progress Notes (Signed)
Patient ID: Sara Foster, female   DOB: 1995/07/28, 19 y.o.   MRN: [id number] History of Present Illness: Sara Foster is a 19 year old white female for well woman exam and first pap.She is complaining of having to pee a lot and is up at night about 5 times. She had vaginal delivery about 5 months ago and had these symptoms before that and during pregnancy.She is on depo and happy with it.   Current Medications, Allergies, Past Medical History, Past Surgical History, Family History and Social History were [facility].     Review of Systems: Patient denies any headaches, hearing loss, fatigue, blurred vision, shortness of breath, chest pain, abdominal pain, problems with bowel movements(has about every 2-3 days) or intercourse. No joint pain or mood swings.See HPI for positives.    Physical Exam:BP 118/60 mmHg  Pulse 76  Ht 5' 3.25" (1.607 m)  Wt 138 lb (62.596 kg)  BMI 24.24 kg/m2  Breastfeeding? No Urine dipstick negative. General:  Well developed, well nourished, no acute distress Skin:  Warm and dry Neck:  Midline trachea, normal thyroid, good ROM, no lymphadenopathy Lungs; Clear to auscultation bilaterally Breast:  No dominant palpable mass, retraction, or nipple discharge Cardiovascular: Regular rate and rhythm Abdomen:  Soft, non tender, no hepatosplenomegaly Pelvic:  External genitalia is normal in appearance, no lesions.  The vagina is normal in appearance,with good color, moisture and rugae. Urethra has no lesions or masses. The cervix is bulbous.Pap with GC/CHL performed.  Uterus is felt to be normal size, shape, and contour.  No adnexal masses or tenderness noted.Bladder is non tender, no masses felt. Extremities/musculoskeletal:  No swelling or varicosities noted, no clubbing or cyanosis Psych:  No mood changes, alert and cooperative,seems happy   Impression: Well woman gyn exam with pap OAB Contraceptive  management    Plan: Rx Depo provera 150 mg, disp.# 1 vail for IM injection every 3 months in office with 4 refills Rx vesicare 5 mg #30 1 daily with 3 refills,try at bedtime Decrease caffeine esp at night Call me back in about 1 month to see how vesicare is doing Physical in 1 year, pap in 2 if normal

## 2014-05-07 LAB — CYTOLOGY - PAP

## 2014-05-12 ENCOUNTER — Encounter (HOSPITAL_COMMUNITY): Payer: Self-pay | Admitting: *Deleted

## 2014-05-12 ENCOUNTER — Emergency Department (HOSPITAL_COMMUNITY)
Admission: EM | Admit: 2014-05-12 | Discharge: 2014-05-12 | Disposition: A | Discharge status: Home / Self Care | Payer: Medicaid Other | Source: Home / Self Care | Attending: Family Medicine | Admitting: Family Medicine

## 2014-05-12 DIAGNOSIS — J9801 Acute bronchospasm: Secondary | ICD-10-CM | POA: Diagnosis not present

## 2014-05-12 DIAGNOSIS — J069 Acute upper respiratory infection, unspecified: Secondary | ICD-10-CM

## 2014-05-12 LAB — POCT RAPID STREP A: Streptococcus, Group A Screen (Direct): NEGATIVE

## 2014-05-12 MED ORDER — ALBUTEROL SULFATE HFA 108 (90 BASE) MCG/ACT IN AERS: 2.0000 | INHALATION_SPRAY | RESPIRATORY_TRACT | Status: DC | PRN

## 2014-05-12 MED ORDER — IPRATROPIUM BROMIDE 0.06 % NA SOLN: 2.0000 | Freq: Four times a day (QID) | NASAL | Status: DC

## 2014-05-12 NOTE — ED Provider Notes (Signed)
CSN: [id number]     Arrival date & time 05/12/14  1112 History   First MD Initiated Contact with Patient 05/12/14 1327     Chief Complaint  Patient presents with  . Sore Throat  . Cough   (Consider location/radiation/quality/duration/timing/severity/associated sxs/prior Treatment) HPI Comments: 19 year old female complaining of sore throat, cough, PND, runny nose for 2-3 days. She is taking Robitussin and she is no better. She denies having fever.   Past Medical History  Diagnosis Date  . Migraines   . Gonorrhea 2015  . Chlamydia 2015  . OAB (overactive bladder) 05/06/2014   Past Surgical History  Procedure Laterality Date  . No past surgeries     Family History  Problem Relation Age of Onset  . Cancer      maternal great grandma  . Asthma Mother   . Lung disease Father   . Arthritis Maternal Grandmother   . Lung disease Maternal Grandfather   . Heart disease Paternal Grandfather    History  Substance Use Topics  . Smoking status: Never Smoker   . Smokeless tobacco: Never Used  . Alcohol Use: No   OB History    Gravida Para Term Preterm AB TAB SAB Ectopic Multiple Living   1 1 1       1      Review of Systems  Constitutional: Positive for activity change. Negative for fever, chills, appetite change and fatigue.  HENT: Positive for congestion, postnasal drip, rhinorrhea and sore throat. Negative for ear pain, facial swelling, mouth sores and trouble swallowing.   Eyes: Negative.   Respiratory: Positive for cough. Negative for shortness of breath and wheezing.   Cardiovascular: Negative.   Gastrointestinal: Negative.   Musculoskeletal: Negative for neck pain and neck stiffness.  Skin: Negative for pallor and rash.  Neurological: Negative.     Allergies  Review of patient's allergies indicates no known allergies.  Home Medications   Prior to Admission medications   Medication Sig Start Date End Date Taking? Authorizing Provider  medroxyPROGESTERone  (DEPO-PROVERA) 150 MG/ML injection Inject 1 mL (150 mg total) into the muscle every 3 (three) months. 05/06/14  Yes Adline PotterJennifer A Griffin, NP  Multiple Vitamins-Calcium (ONE-A-DAY WOMENS FORMULA PO) Take by mouth daily.   Yes Historical Provider, MD  solifenacin (VESICARE) 5 MG tablet Take 1 tablet (5 mg total) by mouth daily. 05/06/14  Yes Adline PotterJennifer A Griffin, NP  albuterol (PROVENTIL HFA;VENTOLIN HFA) 108 (90 BASE) MCG/ACT inhaler Inhale 2 puffs into the lungs every 4 (four) hours as needed for wheezing or shortness of breath. 05/12/14   Hayden Rasmussenavid Malissa Slay, NP  ipratropium (ATROVENT) 0.06 % nasal spray Place 2 sprays into both nostrils 4 (four) times daily. 05/12/14   Hayden Rasmussenavid Yona Stansbury, NP   BP 113/75 mmHg  Pulse 89  Temp(Src) 99.1 F (37.3 C) (Oral)  Resp 16  SpO2 97%  LMP 05/08/2014  Breastfeeding? No Physical Exam  Constitutional: She is oriented to person, place, and time. She appears well-developed and well-nourished. No distress.  HENT:  Mouth/Throat: No oropharyngeal exudate.  Bilateral TMs are normal. Oropharynx mildly erythematous with clear PND. No exudates or swelling.  Eyes: Conjunctivae and EOM are normal.  Neck: Normal range of motion. Neck supple.  Cardiovascular: Normal rate, regular rhythm and normal heart sounds.   Pulmonary/Chest: Effort normal. No respiratory distress. She has wheezes.  With deep inspiration and expiration no adventitious sounds. Having patient cough there is upper bilateral coarseness.  Abdominal: Soft. There is no tenderness.  Musculoskeletal: Normal range  of motion. She exhibits no edema.  Lymphadenopathy:    She has no cervical adenopathy.  Neurological: She is alert and oriented to person, place, and time.  Skin: Skin is warm and dry. No rash noted.  Psychiatric: She has a normal mood and affect.  Nursing note and vitals reviewed.   ED Course  Procedures (including critical care time) Labs Review Labs Reviewed  POCT RAPID STREP A ([facility] ONLY)    Results for orders placed or performed during the hospital encounter of 05/12/14  POCT rapid strep A [facility])  Result Value Ref Range   Streptococcus, Group A Screen (Direct) NEGATIVE NEGATIVE    Imaging Review No results found.   MDM   1. URI (upper respiratory infection)   2. Cough due to bronchospasm     May use alka seltzer cold plus, or NyQuil. He is containing any histamines touching the to help limit draining. For nondrowsy treatment may use DayQuil with a combination with either Allegra or Claritin or Zyrtec Use the Atrovent nasal spray to help with runny nose Drink lots of fluids stay well-hydrated    Hayden Rasmussen, NP 05/12/14 1412  Hayden Rasmussen, NP 05/12/14 1416

## 2014-05-12 NOTE — ED Notes (Signed)
C/o sore throat and cough onset 2 days ago.  Has tried Robitussin.  No chills of fever.  Cough is prod. of thick yellow sputum. States her nose has been running constantly.  Boyfriends daughter has tonsillitis.

## 2014-05-12 NOTE — Discharge Instructions (Signed)
Bronchospasm A bronchospasm is when the tubes that carry air in and out of your lungs (airways) spasm or tighten. During a bronchospasm it is hard to breathe. This is because the airways get smaller. A bronchospasm can be triggered by:  Allergies. These may be to animals, pollen, food, or mold.  Infection. This is a common cause of bronchospasm.  Exercise.  Irritants. These include pollution, cigarette smoke, strong odors, aerosol sprays, and paint fumes.  Weather changes.  Stress.  Being emotional. HOME CARE   Always have a plan for getting help. Know when to call your doctor and local emergency services (911 in the [place] Know where you can get emergency care.  Only take medicines as told by your doctor.  If you were prescribed an inhaler or nebulizer machine, ask your doctor how to use it correctly. Always use a spacer with your inhaler if you were given one.  Stay calm during an attack. Try to relax and breathe more slowly.  Control your home environment:  Change your heating and air conditioning filter at least once a month.  Limit your use of fireplaces and wood stoves.  Do not  smoke. Do not  allow smoking in your home.  Avoid perfumes and fragrances.  Get rid of pests (such as roaches and mice) and their droppings.  Throw away plants if you see mold on them.  Keep your house clean and dust free.  Replace carpet with wood, tile, or vinyl flooring. Carpet can trap dander and dust.  Use allergy-proof pillows, mattress covers, and box spring covers.  Wash bed sheets and blankets every week in hot water. Dry them in a dryer.  Use blankets that are made of polyester or cotton.  Wash hands frequently. GET HELP IF:  You have muscle aches.  You have chest pain.  The thick spit you spit or cough up (sputum) changes from clear or white to yellow, green, gray, or bloody.  The thick spit you spit or cough up gets thicker.  There are problems that may be related  to the medicine you are given such as:  A rash.  Itching.  Swelling.  Trouble breathing. GET HELP RIGHT AWAY IF:  You feel you cannot breathe or catch your breath.  You cannot stop coughing.  Your treatment is not helping you breathe better.  You have very bad chest pain. MAKE SURE YOU:   Understand these instructions.  Will watch your condition.  Will get help right away if you are not doing well or get worse. Document Released: 12/27/2008 Document Revised: 03/06/2013 Document Reviewed: 08/22/2012 [facility] Patient Information 2015 [place], [place]. This information is not intended to replace advice given to you by your health care provider. Make sure you discuss any questions you have with your health care provider.  Cough, Adult  A cough is a reflex. It helps you clear your throat and airways. A cough can help heal your body. A cough can last 2 or 3 weeks (acute) or may last more than 8 weeks (chronic). Some common causes of a cough can include an infection, allergy, or a cold. HOME CARE  Only take medicine as told by your doctor.  If given, take your medicines (antibiotics) as told. Finish them even if you start to feel better.  Use a cold steam vaporizer or humidifier in your home. This can help loosen thick spit (secretions).  Sleep so you are almost sitting up (semi-upright). Use pillows to do this. This helps reduce coughing.  Rest as needed.  Stop smoking if you smoke. GET HELP RIGHT AWAY IF:  You have yellowish-white fluid (pus) in your thick spit.  Your cough gets worse.  Your medicine does not reduce coughing, and you are losing sleep.  You cough up blood.  You have trouble breathing.  Your pain gets worse and medicine does not help.  You have a fever. MAKE SURE YOU:   Understand these instructions.  Will watch your condition.  Will get help right away if you are not doing well or get worse. Document Released: 11/12/2010 Document Revised:  07/16/2013 Document Reviewed: 11/12/2010 [facility] Patient Information 2015 [place], [place]. This information is not intended to replace advice given to you by your health care provider. Make sure you discuss any questions you have with your health care provider.  How to Use an Inhaler Using your inhaler correctly is very important. Good technique will make sure that the medicine reaches your lungs.  HOW TO USE AN INHALER:  Take the cap off the inhaler.  If this is the first time using your inhaler, you need to prime it. Shake the inhaler for 5 seconds. Release four puffs into the air, away from your face. Ask your doctor for help if you have questions.  Shake the inhaler for 5 seconds.  Turn the inhaler so the bottle is above the mouthpiece.  Put your pointer finger on top of the bottle. Your thumb holds the bottom of the inhaler.  Open your mouth.  Either hold the inhaler away from your mouth (the width of 2 fingers) or place your lips tightly around the mouthpiece. Ask your doctor which way to use your inhaler.  Breathe out as much air as possible.  Breathe in and push down on the bottle 1 time to release the medicine. You will feel the medicine go in your mouth and throat.  Continue to take a deep breath in very slowly. Try to fill your lungs.  After you have breathed in completely, hold your breath for 10 seconds. This will help the medicine to settle in your lungs. If you cannot hold your breath for 10 seconds, hold it for as long as you can before you breathe out.  Breathe out slowly, through pursed lips. Whistling is an example of pursed lips.  If your doctor has told you to take more than 1 puff, wait at least 15-30 seconds between puffs. This will help you get the best results from your medicine. Do not use the inhaler more than your doctor tells you to.  Put the cap back on the inhaler.  Follow the directions from your doctor or from the inhaler package about cleaning the  inhaler. If you use more than one inhaler, ask your doctor which inhalers to use and what order to use them in. Ask your doctor to help you figure out when you will need to refill your inhaler.  If you use a steroid inhaler, always rinse your mouth with water after your last puff, gargle and spit out the water. Do not swallow the water. GET HELP IF:  The inhaler medicine only partially helps to stop wheezing or shortness of breath.  You are having trouble using your inhaler.  You have some increase in thick spit (phlegm). GET HELP RIGHT AWAY IF:  The inhaler medicine does not help your wheezing or shortness of breath or you have tightness in your chest.  You have dizziness, headaches, or fast heart rate.  You have chills, fever,  or night sweats.  You have a large increase of thick spit, or your thick spit is bloody. MAKE SURE YOU:   Understand these instructions.  Will watch your condition.  Will get help right away if you are not doing well or get worse. Document Released: 12/09/2007 Document Revised: 12/20/2012 Document Reviewed: 09/28/2012 [facility] Patient Information 2015 [place]. This information is not intended to replace advice given to you by your health care provider. Make sure you discuss any questions you have with your health care provider.  Upper Respiratory Infection, Adult May use alka seltzer cold plus, or NyQuil. Need to use antihistamine to help limit draining. For nondrowsy treatment may use DayQuil with a combination with either Allegra or Claritin or Zyrtec Use the Atrovent nasal spray to help with runny nose Drink lots of fluids stay well-hydrated An upper respiratory infection (URI) is also sometimes known as the common cold. The upper respiratory tract includes the nose, sinuses, throat, trachea, and bronchi. Bronchi are the airways leading to the lungs. Most people improve within 1 week, but symptoms can last up to 2 weeks. A residual cough may last  even longer.  CAUSES Many different viruses can infect the tissues lining the upper respiratory tract. The tissues become irritated and inflamed and often become very moist. Mucus production is also common. A cold is contagious. You can easily spread the virus to others by oral contact. This includes kissing, sharing a glass, coughing, or sneezing. Touching your mouth or nose and then touching a surface, which is then touched by another person, can also spread the virus. SYMPTOMS  Symptoms typically develop 1 to 3 days after you come in contact with a cold virus. Symptoms vary from person to person. They may include:  Runny nose.  Sneezing.  Nasal congestion.  Sinus irritation.  Sore throat.  Loss of voice (laryngitis).  Cough.  Fatigue.  Muscle aches.  Loss of appetite.  Headache.  Low-grade fever. DIAGNOSIS  You might diagnose your own cold based on familiar symptoms, since most people get a cold 2 to 3 times a year. Your caregiver can confirm this based on your exam. Most importantly, your caregiver can check that your symptoms are not due to another disease such as strep throat, sinusitis, pneumonia, asthma, or epiglottitis. Blood tests, throat tests, and X-rays are not necessary to diagnose a common cold, but they may sometimes be helpful in excluding other more serious diseases. Your caregiver will decide if any further tests are required. RISKS AND COMPLICATIONS  You may be at risk for a more severe case of the common cold if you smoke cigarettes, have chronic heart disease (such as heart failure) or lung disease (such as asthma), or if you have a weakened immune system. The very young and very old are also at risk for more serious infections. Bacterial sinusitis, middle ear infections, and bacterial pneumonia can complicate the common cold. The common cold can worsen asthma and chronic obstructive pulmonary disease (COPD). Sometimes, these complications can require emergency  medical care and may be life-threatening. PREVENTION  The best way to protect against getting a cold is to practice good hygiene. Avoid oral or hand contact with people with cold symptoms. Wash your hands often if contact occurs. There is no clear evidence that vitamin C, vitamin E, echinacea, or exercise reduces the chance of developing a cold. However, it is always recommended to get plenty of rest and practice good nutrition. TREATMENT  Treatment is directed at relieving symptoms.  There is no cure. Antibiotics are not effective, because the infection is caused by a virus, not by bacteria. Treatment may include:  Increased fluid intake. Sports drinks offer valuable electrolytes, sugars, and fluids.  Breathing heated mist or steam (vaporizer or shower).  Eating chicken soup or other clear broths, and maintaining good nutrition.  Getting plenty of rest.  Using gargles or lozenges for comfort.  Controlling fevers with ibuprofen or acetaminophen as directed by your caregiver.  Increasing usage of your inhaler if you have asthma. Zinc gel and zinc lozenges, taken in the first 24 hours of the common cold, can shorten the duration and lessen the severity of symptoms. Pain medicines may help with fever, muscle aches, and throat pain. A variety of non-prescription medicines are available to treat congestion and runny nose. Your caregiver can make recommendations and may suggest nasal or lung inhalers for other symptoms.  HOME CARE INSTRUCTIONS   Only take over-the-counter or prescription medicines for pain, discomfort, or fever as directed by your caregiver.  Use a warm mist humidifier or inhale steam from a shower to increase air moisture. This may keep secretions moist and make it easier to breathe.  Drink enough water and fluids to keep your urine clear or pale yellow.  Rest as needed.  Return to work when your temperature has returned to normal or as your caregiver advises. You may need to  stay home longer to avoid infecting others. You can also use a face mask and careful hand washing to prevent spread of the virus. SEEK MEDICAL CARE IF:   After the first few days, you feel you are getting worse rather than better.  You need your caregiver's advice about medicines to control symptoms.  You develop chills, worsening shortness of breath, or brown or red sputum. These may be signs of pneumonia.  You develop yellow or brown nasal discharge or pain in the face, especially when you bend forward. These may be signs of sinusitis.  You develop a fever, swollen neck glands, pain with swallowing, or white areas in the back of your throat. These may be signs of strep throat. SEEK IMMEDIATE MEDICAL CARE IF:   You have a fever.  You develop severe or persistent headache, ear pain, sinus pain, or chest pain.  You develop wheezing, a prolonged cough, cough up blood, or have a change in your usual mucus (if you have chronic lung disease).  You develop sore muscles or a stiff neck. Document Released: 08/25/2000 Document Revised: 05/24/2011 Document Reviewed: 06/06/2013 [facility] Patient Information 2015 [place], [place]. This information is not intended to replace advice given to you by your health care provider. Make sure you discuss any questions you have with your health care provider.

## 2014-05-14 LAB — CULTURE, GROUP A STREP: STREP A CULTURE: NEGATIVE

## 2014-07-11 ENCOUNTER — Ambulatory Visit (INDEPENDENT_AMBULATORY_CARE_PROVIDER_SITE_OTHER): Payer: Medicaid Other | Admitting: *Deleted

## 2014-07-11 ENCOUNTER — Encounter: Payer: Self-pay | Admitting: *Deleted

## 2014-07-11 DIAGNOSIS — Z3042 Encounter for surveillance of injectable contraceptive: Secondary | ICD-10-CM | POA: Diagnosis not present

## 2014-07-11 DIAGNOSIS — Z3202 Encounter for pregnancy test, result negative: Secondary | ICD-10-CM | POA: Diagnosis not present

## 2014-07-11 LAB — POCT URINE PREGNANCY: PREG TEST UR: NEGATIVE

## 2014-07-11 MED ORDER — MEDROXYPROGESTERONE ACETATE 150 MG/ML IM SUSP
150.0000 mg | Freq: Once | INTRAMUSCULAR | Status: AC
  Administered 2014-07-11: 150 mg via INTRAMUSCULAR

## 2014-07-11 NOTE — Progress Notes (Signed)
Pt here for Depo. Reports no problems at this time. Return in 12 weeks for next shot. JSY 

## 2014-10-03 ENCOUNTER — Encounter: Payer: Self-pay | Admitting: *Deleted

## 2014-10-03 ENCOUNTER — Ambulatory Visit (INDEPENDENT_AMBULATORY_CARE_PROVIDER_SITE_OTHER): Payer: Medicaid Other | Admitting: *Deleted

## 2014-10-03 DIAGNOSIS — Z3042 Encounter for surveillance of injectable contraceptive: Secondary | ICD-10-CM

## 2014-10-03 DIAGNOSIS — Z3202 Encounter for pregnancy test, result negative: Secondary | ICD-10-CM | POA: Diagnosis not present

## 2014-10-03 LAB — POCT URINE PREGNANCY: Preg Test, Ur: NEGATIVE

## 2014-10-03 MED ORDER — MEDROXYPROGESTERONE ACETATE 150 MG/ML IM SUSP: 150.0000 mg | INTRAMUSCULAR | Status: DC

## 2014-10-03 MED ORDER — MEDROXYPROGESTERONE ACETATE 150 MG/ML IM SUSP
150.0000 mg | Freq: Once | INTRAMUSCULAR | Status: AC
  Administered 2014-10-03: 150 mg via INTRAMUSCULAR

## 2014-10-03 NOTE — Progress Notes (Signed)
Patient ID: Sara Foster, female   DOB: [birth year], 21 y.o.   MRN: [id number] Depo Provera 150 mg given Right Ventrogluteal with no complications, negative pregnancy test. Pt to return in 12 weeks for next injections. Pt requesting a refill on the Depo Provera.

## 2014-12-26 ENCOUNTER — Ambulatory Visit: Payer: Medicaid Other

## 2014-12-27 ENCOUNTER — Ambulatory Visit (INDEPENDENT_AMBULATORY_CARE_PROVIDER_SITE_OTHER): Payer: Medicaid Other | Admitting: *Deleted

## 2014-12-27 ENCOUNTER — Encounter: Payer: Self-pay | Admitting: *Deleted

## 2014-12-27 DIAGNOSIS — Z3042 Encounter for surveillance of injectable contraceptive: Secondary | ICD-10-CM | POA: Diagnosis not present

## 2014-12-27 DIAGNOSIS — Z3202 Encounter for pregnancy test, result negative: Secondary | ICD-10-CM

## 2014-12-27 LAB — POCT URINE PREGNANCY: PREG TEST UR: NEGATIVE

## 2014-12-27 MED ORDER — MEDROXYPROGESTERONE ACETATE 150 MG/ML IM SUSP
150.0000 mg | Freq: Once | INTRAMUSCULAR | Status: AC
  Administered 2014-12-27: 150 mg via INTRAMUSCULAR

## 2014-12-27 NOTE — Progress Notes (Signed)
Patient ID: Sara Foster, female   DOB: 26-Jun-1995, 19 y.o.   MRN: [id number] Depo Provera 150 mg IM given left Ventrogluteal with no complications, negative pregnancy test. Pt to return in 12 weeks for next injection.

## 2015-03-04 ENCOUNTER — Encounter: Payer: Self-pay | Admitting: *Deleted

## 2015-03-04 ENCOUNTER — Ambulatory Visit (INDEPENDENT_AMBULATORY_CARE_PROVIDER_SITE_OTHER): Payer: Medicaid Other | Admitting: Neurology

## 2015-03-04 ENCOUNTER — Encounter: Payer: Self-pay | Admitting: Neurology

## 2015-03-04 VITALS — BP 111/65 | HR 90 | Ht 63.25 in | Wt 149.4 lb

## 2015-03-04 DIAGNOSIS — R202 Paresthesia of skin: Secondary | ICD-10-CM | POA: Diagnosis not present

## 2015-03-04 DIAGNOSIS — R51 Headache: Secondary | ICD-10-CM | POA: Diagnosis not present

## 2015-03-04 DIAGNOSIS — H539 Unspecified visual disturbance: Secondary | ICD-10-CM

## 2015-03-04 DIAGNOSIS — G43709 Chronic migraine without aura, not intractable, without status migrainosus: Secondary | ICD-10-CM | POA: Diagnosis not present

## 2015-03-04 DIAGNOSIS — R519 Headache, unspecified: Secondary | ICD-10-CM

## 2015-03-04 MED ORDER — TOPIRAMATE 50 MG PO TABS: 50.0000 mg | ORAL_TABLET | Freq: Every day | ORAL | Status: DC

## 2015-03-04 NOTE — Progress Notes (Signed)
[facility]    Provider:  Dr Lucia GaskinsAhern Referring Provider: [facility] Primary Care Physician:  [facility]  CC:  Migraines  HPI:  Sara Foster is a 19 y.o. female here as a referral from [facility]. [facility] for migraines since the age of 19. She has a PMHx of stress and anxiety but no depression, chronic low back pain. She has tried multiple medications: propranolol, maxalt, venlafaxine 75mg , topiramate 25mg , diclofenac, effexor 150, depakote, promethazine, sumatriptan, zoloft 50mg , paroxetine 10mg , clomipramine 25, cymbalta 60, atenolol 25, dexamethazone, naratriptan, trazodone. The headaches are the whole head pounding like a belt around her head. She has light and sound sensitivity, dark places sitting still makes it better, sound sensitivity. She has nausea and vomiting. She has a low-level constant headache with migraines 4-5 times a week. Migraines can last all day. Better when she was pregnant. No FHx of migraines. More frequent and severe in the last 6 months. 30 headache days a month with at least 15 migraines a month. They can be severe 10/10 at the most. She take ibuprofen every day for her back but only in the last few weeks. No associated focal neurologic deficits. She has dizziness with the migraines. She has blurry vision with the headaches. Not positional, no hx of Mirena IUD use, tetracycline use, vit A (associated with pseudotumor). No aura. No triggers. She has problems sleeping.   Reviewed notes, labs and imaging from outside physicians, which showed:  MRI of the brain in April 2011 (personally reviewed images and agree with the following): There is no evidence for acute infarction, intracranial hemorrhage, mass lesion, hydrocephalus, or extra-axial fluid. There is no atrophy or white matter disease. No congenital anomaly is present. There is no evidence for complicated migraine. Calvarium is intact. Coronal images through the  temporal lobes and convexity demonstrate no asymmetry or mass. The major intracranial vascular structures are widely patent. Physiologic enlargement of the pituitary reflects puberty. No tonsillar herniation. IMPRESSION: Negative cranial MRI  Review of Systems: Patient complains of symptoms per HPI as well as the following symptoms: fatigue, blurred visiob, ringing in ear, urination problems, headaches, dizziness, anxiety, restless legs. Pertinent negatives per HPI. All others negative.   Social History   Social History  . Marital Status: Single    Spouse Name: N/A  . Number of Children: 1  . Years of Education: 12   Occupational History  . Not on file.   Social History Main Topics  . Smoking status: Never Smoker   . Smokeless tobacco: Never Used  . Alcohol Use: No  . Drug Use: No  . Sexual Activity: Yes    Birth Control/ Protection: Injection   Other Topics Concern  . Not on file   Social History Narrative   Lives with boyfriend and daughter   Caffeine use: 1 cup tea per day   Coffee occass    Family History  Problem Relation Age of Onset  . Cancer      maternal great grandma  . Migraines    . Asthma Mother   . Lung disease Father   . Arthritis Maternal Grandmother   . Lung disease Maternal Grandfather   . Heart disease Paternal Grandfather     Past Medical History  Diagnosis Date  . Migraines   . Gonorrhea 2015  . Chlamydia 2015  . OAB (overactive bladder) 05/06/2014  . Generalized anxiety disorder   . Low back pain     Past Surgical History  Procedure Laterality  Date  . Wisdom tooth extraction      Current Outpatient Prescriptions  Medication Sig Dispense Refill  . ibuprofen (ADVIL,MOTRIN) 800 MG tablet Take 800 mg by mouth every 8 (eight) hours as needed.    . medroxyPROGESTERone (DEPO-PROVERA) 150 MG/ML injection Inject 1 mL (150 mg total) into the muscle every 3 (three) months. 1 mL 3  . medroxyPROGESTERone (DEPO-PROVERA) 150 MG/ML injection  Inject 1 mL (150 mg total) into the muscle every 3 (three) months. 1 mL 3  . Multiple Vitamins-Calcium (ONE-A-DAY WOMENS FORMULA PO) Take by mouth daily.    . promethazine (PHENERGAN) 25 MG tablet Take 25 mg by mouth as needed.  5  . solifenacin (VESICARE) 5 MG tablet Take 5 mg by mouth daily.    Marland Kitchen topiramate (TOPAMAX) 50 MG tablet Take 1 tablet (50 mg total) by mouth at bedtime. 30 tablet 11  . [DISCONTINUED] PARoxetine (PAXIL) 40 MG tablet Take 40 mg by mouth every morning.      No current facility-administered medications for this visit.    Allergies as of 03/04/2015  . (No Known Allergies)    Vitals: BP 111/65 mmHg  Pulse 90  Ht 5' 3.25" (1.607 m)  Wt 149 lb 6.4 oz (67.767 kg)  BMI 26.24 kg/m2 Last Weight:  Wt Readings from Last 1 Encounters:  03/04/15 149 lb 6.4 oz (67.767 kg)   Last Height:   Ht Readings from Last 1 Encounters:  03/04/15 5' 3.25" (1.607 m)    Physical exam: Exam: Gen: NAD, conversant, well nourised, well groomed                     CV: RRR, no MRG. No Carotid Bruits. No peripheral edema, warm, nontender Eyes: Conjunctivae clear without exudates or hemorrhage  Neuro: Detailed Neurologic Exam  Speech:    Speech is normal; fluent and spontaneous with normal comprehension.  Cognition:    The patient is oriented to person, place, and time;     recent and remote memory intact;     language fluent;     normal attention, concentration,     fund of knowledge Cranial Nerves:    The pupils are equal, round, and reactive to light. The fundi are normal and spontaneous venous pulsations are present. Visual fields are full to finger confrontation. Extraocular movements are intact. Trigeminal sensation is intact and the muscles of mastication are normal. The face is symmetric. The palate elevates in the midline. Hearing intact. Voice is normal. Shoulder shrug is normal. The tongue has normal motion without fasciculations.   Coordination:    Normal finger to  nose and heel to shin. Normal rapid alternating movements.   Gait:    Heel-toe and tandem gait are normal.   Motor Observation:    No asymmetry, no atrophy, and no involuntary movements noted. Tone:    Normal muscle tone.    Posture:    Posture is normal. normal erect    Strength:    Strength is V/V in the upper and lower limbs.      Sensation: intact to LT     Reflex Exam:  DTR's:    Deep tendon reflexes in the upper and lower extremities are normal bilaterally.   Toes:    The toes are downgoing bilaterally.   Clonus:    Clonus is absent.      Assessment/Plan:  19 year old with chronic migraines without aura, with status migrainosis, intractable  - MRi of the brain - Start  Topamax  qhs - Will request recent labs from primary care  To prevent or relieve headaches, try the following: Cool Compress. Lie down and place a cool compress on your head.  Avoid headache triggers. If certain foods or odors seem to have triggered your migraines in the past, avoid them. A headache diary might help you identify triggers.  Include physical activity in your daily routine. Try a daily walk or other moderate aerobic exercise.  Manage stress. Find healthy ways to cope with the stressors, such as delegating tasks on your to-do list.  Practice relaxation techniques. Try deep breathing, yoga, massage and visualization.  Eat regularly. Eating regularly scheduled meals and maintaining a healthy diet might help prevent headaches. Also, drink plenty of fluids.  Follow a regular sleep schedule. Sleep deprivation might contribute to headaches Consider biofeedback. With this mind-body technique, you learn to control certain bodily functions - such as muscle tension, heart rate and blood pressure - to prevent headaches or reduce headache pain.    Proceed to emergency room if you experience new or worsening symptoms or symptoms do not resolve, if you have new neurologic symptoms or if headache  is severe, or for any concerning symptom.     Naomie Dean, MD  [facility] [address] [place], [place] [address]  Phone [phone] Fax [phone]

## 2015-03-04 NOTE — Patient Instructions (Signed)
Remember to drink plenty of fluid, eat healthy meals and do not skip any meals. Try to eat protein with a every meal and eat a healthy snack such as fruit or nuts in between meals. Try to keep a regular sleep-wake schedule and try to exercise daily, particularly in the form of walking, 20-30 minutes a day, if you can.   As far as your medications are concerned, I would like to suggest: topamax 50mg  once at night every night  As far as diagnostic testing: MRI of the brain  I would like to see you back in 4 months, sooner if we need to. Please call us with any interim questions, concerns, problems, updates or refill requests.   Please also call us for any test results so we can go over those with you on the phone.  My clinical assistant and will answer any of your questions and relay your messages to me and also relay most of my messages to you.   Our phone number is [phone]. We also have an after hours call service for urgent matters and there is a physician on-call for urgent questions. For any emergencies you know to call 911 or go to the nearest emergency room

## 2015-03-21 ENCOUNTER — Ambulatory Visit: Payer: Medicaid Other

## 2015-03-21 ENCOUNTER — Ambulatory Visit: Payer: Medicaid Other | Admitting: Adult Health

## 2015-03-24 ENCOUNTER — Encounter: Payer: Self-pay | Admitting: Adult Health

## 2015-03-24 ENCOUNTER — Ambulatory Visit: Admission: RE | Admit: 2015-03-24 | Payer: Self-pay | Source: Ambulatory Visit

## 2015-03-24 ENCOUNTER — Ambulatory Visit (INDEPENDENT_AMBULATORY_CARE_PROVIDER_SITE_OTHER): Payer: Medicaid Other | Admitting: Adult Health

## 2015-03-24 VITALS — BP 132/62 | HR 84 | Ht 63.0 in | Wt 143.0 lb

## 2015-03-24 DIAGNOSIS — Z3009 Encounter for other general counseling and advice on contraception: Secondary | ICD-10-CM | POA: Insufficient documentation

## 2015-03-24 HISTORY — DX: Encounter for other general counseling and advice on contraception: Z30.09

## 2015-03-24 NOTE — Patient Instructions (Signed)
No sex [facility] 10/17 [facility] and IUD insertion in pm with Selena BattenKim

## 2015-03-24 NOTE — Progress Notes (Signed)
Subjective:     Patient ID: Sara Foster, female   DOB: 01-Apr-1995, 20 y.o.   MRN: [id number]  HPI Sara Foster is a 20 year old white female, in to discuss stopping Depo and getting IUD, her last depo was 12/27/14 and last sex was 03/22/15.   Review of Systems Patient denies any headaches, hearing loss, fatigue, blurred vision, shortness of breath, chest pain, abdominal pain, problems with bowel movements, urination, or intercourse. No joint pain or mood swings. Reviewed past medical,surgical, social and family history. Reviewed medications and allergies.     Objective:   Physical Exam BP 132/62 mmHg  Pulse 84  Ht 5\' 3"  (1.6 m)  Wt 143 lb (64.864 kg)  BMI 25.34 kg/m2  LMP 03/21/2015  Breastfeeding? No Skin warm and dry. Neck: mid line trachea, normal thyroid, good ROM, no lymphadenopathy noted. Lungs: clear to ausculation bilaterally. Cardiovascular: regular rate and rhythm.Discussed IUD and she wants to try it.    Assessment:     Contraceptive ed.    Plan:     No sex  [facility] and then return in pm for IUD insertion with Selena BattenKim review handout on IUD

## 2015-04-01 ENCOUNTER — Other Ambulatory Visit: Payer: Medicaid Other

## 2015-04-01 ENCOUNTER — Encounter: Payer: Self-pay | Admitting: Women's Health

## 2015-04-01 ENCOUNTER — Ambulatory Visit (INDEPENDENT_AMBULATORY_CARE_PROVIDER_SITE_OTHER): Payer: Medicaid Other | Admitting: Women's Health

## 2015-04-01 VITALS — BP 110/58 | HR 76 | Wt 143.0 lb

## 2015-04-01 DIAGNOSIS — Z30430 Encounter for insertion of intrauterine contraceptive device: Secondary | ICD-10-CM

## 2015-04-01 DIAGNOSIS — Z30014 Encounter for initial prescription of intrauterine contraceptive device: Secondary | ICD-10-CM

## 2015-04-01 DIAGNOSIS — Z3040 Encounter for surveillance of contraceptives, unspecified: Secondary | ICD-10-CM

## 2015-04-01 LAB — BETA HCG QUANT (REF LAB): hCG Quant: <1 m[IU]/mL

## 2015-04-01 NOTE — Progress Notes (Signed)
Patient ID: Sara Foster, female   DOB: Apr 15, 1995, 20 y.o.   MRN: [id number] [name] is a 20 y.o. year old [id number] Caucasian female who presents for placement of a Mirena IUD.  Patient's last menstrual period was 03/21/2015. BP 110/58 mmHg  Pulse 76  Wt 143 lb (64.864 kg)  LMP 03/21/2015 Last sexual intercourse was 03/22/15, and beta pregnancy test today was neg  The risks and benefits of the method and placement have been thouroughly reviewed with the patient and all questions were answered.  Specifically the patient is aware of failure rate of 03/998, expulsion of the IUD and of possible perforation.  The patient is aware of irregular bleeding due to the method and understands the incidence of irregular bleeding diminishes with time.  Signed copy of informed consent in chart.   Time out was performed.  A graves speculum was placed in the vagina.  The cervix was visualized, prepped using Betadine, and grasped with a single tooth tenaculum. The uterus was found to be anteroflexed and it sounded to 7 cm.  Mirena IUD placed per manufacturer's recommendations.   The strings were trimmed to 3 cm.  Sonogram was performed and the proper placement of the IUD was verified via transvaginal u/s.   The patient was given post procedure instructions, including signs and symptoms of infection and to check for the strings after each menses or each month, and refraining from intercourse or anything in the vagina for 3 days.  She was given a Mirena care card with date IUD placed, and date IUD to be removed.  She is scheduled for a f/u appointment in 4 weeks.  Marge Duncans CNM, [facility] 04/01/2015 4:14 PM

## 2015-04-01 NOTE — Patient Instructions (Signed)
 Nothing in vagina for 3 days (no sex, douching, tampons, etc...)  Check your strings once a month to make sure you can feel them, if you are not able to please let us know  If you develop a fever of 100.4 or more in the next few weeks, or if you develop severe abdominal pain, please let us know  Use a backup method of birth control, such as condoms, for 2 weeks   Levonorgestrel intrauterine device (IUD) What is this medicine? LEVONORGESTREL IUD (LEE voe nor jes trel) is a contraceptive (birth control) device. The device is placed inside the uterus by a healthcare professional. It is used to prevent pregnancy and can also be used to treat heavy bleeding that occurs during your period. Depending on the device, it can be used for 3 to 5 years. This medicine may be used for other purposes; ask your health care provider or pharmacist if you have questions. What should I tell my health care provider before I take this medicine? They need to know if you have any of these conditions: -abnormal Pap smear -cancer of the breast, uterus, or cervix -diabetes -endometritis -genital or pelvic infection now or in the past -have more than one sexual partner or your partner has more than one partner -heart disease -history of an ectopic or tubal pregnancy -immune system problems -IUD in place -liver disease or tumor -problems with blood clots or take blood-thinners -use intravenous drugs -uterus of unusual shape -vaginal bleeding that has not been explained -an unusual or allergic reaction to levonorgestrel, other hormones, silicone, or polyethylene, medicines, foods, dyes, or preservatives -pregnant or trying to get pregnant -breast-feeding How should I use this medicine? This device is placed inside the uterus by a health care professional. Talk to your pediatrician regarding the use of this medicine in children. Special care may be needed. Overdosage: If you think you have taken too much of  this medicine contact a poison control center or emergency room at once. NOTE: This medicine is only for you. Do not share this medicine with others. What if I miss a dose? This does not apply. What may interact with this medicine? Do not take this medicine with any of the following medications: -amprenavir -bosentan -fosamprenavir This medicine may also interact with the following medications: -aprepitant -barbiturate medicines for inducing sleep or treating seizures -bexarotene -griseofulvin -medicines to treat seizures like carbamazepine, ethotoin, felbamate, oxcarbazepine, phenytoin, topiramate -modafinil -pioglitazone -rifabutin -rifampin -rifapentine -some medicines to treat HIV infection like atazanavir, indinavir, lopinavir, nelfinavir, tipranavir, ritonavir -St. John's wort -warfarin This list may not describe all possible interactions. Give your health care provider a list of all the medicines, herbs, non-prescription drugs, or dietary supplements you use. Also tell them if you smoke, drink alcohol, or use illegal drugs. Some items may interact with your medicine. What should I watch for while using this medicine? Visit your doctor or health care professional for regular check ups. See your doctor if you or your partner has sexual contact with others, becomes HIV positive, or gets a sexual transmitted disease. This product does not protect you against HIV infection (AIDS) or other sexually transmitted diseases. You can check the placement of the IUD yourself by reaching up to the top of your vagina with clean fingers to feel the threads. Do not pull on the threads. It is a good habit to check placement after each menstrual period. Call your doctor right away if you feel more of the IUD than just   the threads or if you cannot feel the threads at all. The IUD may come out by itself. You may become pregnant if the device comes out. If you notice that the IUD has come out use a  backup birth control method like condoms and call your health care provider. Using tampons will not change the position of the IUD and are okay to use during your period. What side effects may I notice from receiving this medicine? Side effects that you should report to your doctor or health care professional as soon as possible: -allergic reactions like skin rash, itching or hives, swelling of the face, lips, or tongue -fever, flu-like symptoms -genital sores -high blood pressure -no menstrual period for 6 weeks during use -pain, swelling, warmth in the leg -pelvic pain or tenderness -severe or sudden headache -signs of pregnancy -stomach cramping -sudden shortness of breath -trouble with balance, talking, or walking -unusual vaginal bleeding, discharge -yellowing of the eyes or skin Side effects that usually do not require medical attention (report to your doctor or health care professional if they continue or are bothersome): -acne -breast pain -change in sex drive or performance -changes in weight -cramping, dizziness, or faintness while the device is being inserted -headache -irregular menstrual bleeding within first 3 to 6 months of use -nausea This list may not describe all possible side effects. Call your doctor for medical advice about side effects. You may report side effects to FDA at 1-800-FDA-1088. Where should I keep my medicine? This does not apply. NOTE: This sheet is a summary. It may not cover all possible information. If you have questions about this medicine, talk to your doctor, pharmacist, or health care provider.    2016, Elsevier/Gold Standard. (2011-04-01 13:54:04)  

## 2015-05-05 ENCOUNTER — Ambulatory Visit: Payer: Medicaid Other | Admitting: Women's Health

## 2015-05-09 ENCOUNTER — Ambulatory Visit (INDEPENDENT_AMBULATORY_CARE_PROVIDER_SITE_OTHER): Payer: Medicaid Other | Admitting: Obstetrics and Gynecology

## 2015-05-09 ENCOUNTER — Encounter: Payer: Self-pay | Admitting: Obstetrics and Gynecology

## 2015-05-09 VITALS — BP 120/76 | Ht 63.0 in | Wt 143.0 lb

## 2015-05-09 DIAGNOSIS — Z30431 Encounter for routine checking of intrauterine contraceptive device: Secondary | ICD-10-CM

## 2015-05-09 NOTE — Progress Notes (Signed)
Patient ID: Sara Foster, female   DOB: 12-16-1995, 20 y.o.   MRN: [id number] Pt here today for 4 week follow up of IUD insertion. Pt denies any problems or concerns at this time.

## 2015-05-09 NOTE — Progress Notes (Signed)
[facility] Visit  Patient name: Sara Foster MRN [id number]  Date of birth: 1995/08/17  CC & HPI:  Sara Foster is a 20 y.o. female presenting today for 4 week follow up of her IUD insertion. She notes that she is able to feel the mirena strings and her boyfriend has been able to feel them as well with painful intercourse for him. She notes that for the last day of her period, she had thick mucous discharge. Pt reports that she has had 1 pregnancy. Pt denies any issues with her mirena at this time.    ROS:  A complete 10 system review of systems was obtained and all systems are negative except as noted in the HPI and PMH.   Pertinent History Reviewed:   Reviewed: Significant for N/A Medical         Past Medical History  Diagnosis Date  . Migraines   . Gonorrhea 2015  . Chlamydia 2015  . OAB (overactive bladder) 05/06/2014  . Generalized anxiety disorder   . Low back pain   . Encounter for education about contraceptive use 03/24/2015                              Surgical Hx:    Past Surgical History  Procedure Laterality Date  . Wisdom tooth extraction     Medications: Reviewed & Updated - see associated section                       Current outpatient prescriptions:  Marland Kitchen  Mirabegron (MYRBETRIQ PO), Take by mouth., Disp: , Rfl:  .  promethazine (PHENERGAN) 25 MG tablet, Take 25 mg by mouth as needed. Reported on 04/01/2015, Disp: , Rfl: 5 .  solifenacin (VESICARE) 5 MG tablet, Take 5 mg by mouth daily., Disp: , Rfl:  .  [DISCONTINUED] PARoxetine (PAXIL) 40 MG tablet, Take 40 mg by mouth every morning. , Disp: , Rfl:    Social History: Reviewed -  reports that she has never smoked. She has never used smokeless tobacco.  Objective Findings:  Vitals: Blood pressure 120/76, height  (1.6 m), weight 143 lb (64.864 kg), not currently breastfeeding.  Physical Examination: General appearance - alert, well appearing, and in no distress and oriented to person,  place, and time Mental status - alert, oriented to person, place, and time, normal mood, behavior, speech, dress, motor activity, and thought processes Pelvic - normal external genitalia, vulva, vagina, cervix, uterus and adnexa VULVA: normal appearing vulva with no masses, tenderness or lesions  VAGINA: normal appearing vagina with normal color and discharge, no lesions CERVIX: normal appearing cervix without discharge or lesions, mirena in place.String is 3+ cm length reaches post fornix.  UTERUS: uterus is normal size, shape, consistency and non-tender, mid position 1st degree uterine descensus. Which may be contributing to the penis-string contact.   Assessment & Plan:   A:  1. 4 week IUD insertion follow up 2. IUD strings trimmed to 1.5 cm  P:  1. F/U PRN if any further issues with IUD   By signing my name below, I, Soijett Blue, attest that this documentation has been prepared under the direction and in the presence of Tilda Burrow, MD. Electronically Signed: Soijett Blue, ED Scribe. 05/09/2015. 1:13 PM.  I personally performed the services described in this documentation, which was SCRIBED in my presence. The recorded information has been reviewed  and considered accurate. It has been edited as necessary during review. Jonnie Kind, MD

## 2015-07-03 ENCOUNTER — Ambulatory Visit: Payer: Medicaid Other | Admitting: Neurology

## 2015-07-03 ENCOUNTER — Telehealth: Payer: Self-pay | Admitting: *Deleted

## 2015-07-03 ENCOUNTER — Ambulatory Visit: Payer: Self-pay | Admitting: Nurse Practitioner

## 2015-07-03 NOTE — Telephone Encounter (Signed)
Pt no showed appt today

## 2015-07-07 ENCOUNTER — Encounter: Payer: Self-pay | Admitting: Nurse Practitioner

## 2016-09-30 ENCOUNTER — Encounter: Payer: Self-pay | Admitting: Women's Health

## 2016-09-30 ENCOUNTER — Ambulatory Visit (INDEPENDENT_AMBULATORY_CARE_PROVIDER_SITE_OTHER): Payer: Medicaid Other | Admitting: Women's Health

## 2016-09-30 VITALS — BP 122/80 | HR 104 | Wt 137.4 lb

## 2016-09-30 DIAGNOSIS — N920 Excessive and frequent menstruation with regular cycle: Secondary | ICD-10-CM

## 2016-09-30 DIAGNOSIS — B9689 Other specified bacterial agents as the cause of diseases classified elsewhere: Secondary | ICD-10-CM | POA: Diagnosis not present

## 2016-09-30 DIAGNOSIS — N760 Acute vaginitis: Secondary | ICD-10-CM

## 2016-09-30 DIAGNOSIS — R102 Pelvic and perineal pain: Secondary | ICD-10-CM

## 2016-09-30 LAB — POCT WET PREP (WET MOUNT)
Clue Cells Wet Prep Whiff POC: POSITIVE
TRICHOMONAS WET PREP HPF POC: ABSENT

## 2016-09-30 MED ORDER — METRONIDAZOLE 500 MG PO TABS: 500.0000 mg | ORAL_TABLET | Freq: Two times a day (BID) | ORAL | 0 refills | Status: DC

## 2016-09-30 NOTE — Patient Instructions (Signed)
Bacterial Vaginosis Bacterial vaginosis is a vaginal infection that occurs when the normal balance of bacteria in the vagina is disrupted. It results from an overgrowth of certain bacteria. This is the most common vaginal infection among women ages 15-44. Because bacterial vaginosis increases your risk for STIs (sexually transmitted infections), getting treated can help reduce your risk for chlamydia, gonorrhea, herpes, and HIV (human immunodeficiency virus). Treatment is also important for preventing complications in pregnant women, because this condition can cause an early (premature) delivery. What are the causes? This condition is caused by an increase in harmful bacteria that are normally present in small amounts in the vagina. However, the reason that the condition develops is not fully understood. What increases the risk? The following factors may make you more likely to develop this condition:  Having a new sexual partner or multiple sexual partners.  Having unprotected sex.  Douching.  Having an intrauterine device (IUD).  Smoking.  Drug and alcohol abuse.  Taking certain antibiotic medicines.  Being pregnant.  You cannot get bacterial vaginosis from toilet seats, bedding, swimming pools, or contact with objects around you. What are the signs or symptoms? Symptoms of this condition include:  Grey or white vaginal discharge. The discharge can also be watery or foamy.  A fish-like odor with discharge, especially after sexual intercourse or during menstruation.  Itching in and around the vagina.  Burning or pain with urination.  Some women with bacterial vaginosis have no signs or symptoms. How is this diagnosed? This condition is diagnosed based on:  Your medical history.  A physical exam of the vagina.  Testing a sample of vaginal fluid under a microscope to look for a large amount of bad bacteria or abnormal cells. Your health care provider may use a cotton swab  or a small wooden spatula to collect the sample.  How is this treated? This condition is treated with antibiotics. These may be given as a pill, a vaginal cream, or a medicine that is put into the vagina (suppository). If the condition comes back after treatment, a second round of antibiotics may be needed. Follow these instructions at home: Medicines  Take over-the-counter and prescription medicines only as told by your health care provider.  Take or use your antibiotic as told by your health care provider. Do not stop taking or using the antibiotic even if you start to feel better. General instructions  If you have a female sexual partner, tell her that you have a vaginal infection. She should see her health care provider and be treated if she has symptoms. If you have a female sexual partner, he does not need treatment.  During treatment: ? Avoid sexual activity until you finish treatment. ? Do not douche. ? Avoid alcohol as directed by your health care provider. ? Avoid breastfeeding as directed by your health care provider.  Drink enough water and fluids to keep your urine clear or pale yellow.  Keep the area around your vagina and rectum clean. ? Wash the area daily with warm water. ? Wipe yourself from front to back after using the toilet.  Keep all follow-up visits as told by your health care provider. This is important. How is this prevented?  Do not douche.  Wash the outside of your vagina with warm water only.  Use protection when having sex. This includes latex condoms and dental dams.  Limit how many sexual partners you have. To help prevent bacterial vaginosis, it is best to have sex with just   one partner (monogamous).  Make sure you and your sexual partner are tested for STIs.  Wear cotton or cotton-lined underwear.  Avoid wearing tight pants and pantyhose, especially during summer.  Limit the amount of alcohol that you drink.  Do not use any products that  contain nicotine or tobacco, such as cigarettes and e-cigarettes. If you need help quitting, ask your health care provider.  Do not use illegal drugs. Where to find more information:  Centers for Disease Control and Prevention: SolutionApps.co.za  American Sexual Health Association (ASHA): www.ashastd.org  U.S. Department of Health and Health and safety inspector, Office on Women's Health: ConventionalMedicines.si or http://www.anderson-williamson.info/ Contact a health care provider if:  Your symptoms do not improve, even after treatment.  You have more discharge or pain when urinating.  You have a fever.  You have pain in your abdomen.  You have pain during sex.  You have vaginal bleeding between periods. Summary  Bacterial vaginosis is a vaginal infection that occurs when the normal balance of bacteria in the vagina is disrupted.  Because bacterial vaginosis increases your risk for STIs (sexually transmitted infections), getting treated can help reduce your risk for chlamydia, gonorrhea, herpes, and HIV (human immunodeficiency virus). Treatment is also important for preventing complications in pregnant women, because the condition can cause an early (premature) delivery.  This condition is treated with antibiotic medicines. These may be given as a pill, a vaginal cream, or a medicine that is put into the vagina (suppository). This information is not intended to replace advice given to you by your health care provider. Make sure you discuss any questions you have with your health care provider. Document Released: 03/01/2005 Document Revised: 11/15/2015 Document Reviewed: 11/15/2015 Elsevier Interactive Patient Education  2017 Elsevier Inc.  Etonogestrel implant What is this medicine? ETONOGESTREL (et oh noe JES trel) is a contraceptive (birth control) device. It is used to prevent pregnancy. It can be used for up to 3 years. This medicine may be used for other purposes;  ask your health care provider or pharmacist if you have questions. COMMON BRAND NAME(S): Implanon, Nexplanon What should I tell my health care provider before I take this medicine? They need to know if you have any of these conditions: -abnormal vaginal bleeding -blood vessel disease or blood clots -cancer of the breast, cervix, or liver -depression -diabetes -gallbladder disease -headaches -heart disease or recent heart attack -high blood pressure -high cholesterol -kidney disease -liver disease -renal disease -seizures -tobacco smoker -an unusual or allergic reaction to etonogestrel, other hormones, anesthetics or antiseptics, medicines, foods, dyes, or preservatives -pregnant or trying to get pregnant -breast-feeding How should I use this medicine? This device is inserted just under the skin on the inner side of your upper arm by a health care professional. Talk to your pediatrician regarding the use of this medicine in children. Special care may be needed. Overdosage: If you think you have taken too much of this medicine contact a poison control center or emergency room at once. NOTE: This medicine is only for you. Do not share this medicine with others. What if I miss a dose? This does not apply. What may interact with this medicine? Do not take this medicine with any of the following medications: -amprenavir -bosentan -fosamprenavir This medicine may also interact with the following medications: -barbiturate medicines for inducing sleep or treating seizures -certain medicines for fungal infections like ketoconazole and itraconazole -grapefruit juice -griseofulvin -medicines to treat seizures like carbamazepine, felbamate, oxcarbazepine, phenytoin, topiramate -modafinil -phenylbutazone -rifampin -rufinamide -  some medicines to treat HIV infection like atazanavir, indinavir, lopinavir, nelfinavir, tipranavir, ritonavir -St. John's wort This list may not describe all  possible interactions. Give your health care provider a list of all the medicines, herbs, non-prescription drugs, or dietary supplements you use. Also tell them if you smoke, drink alcohol, or use illegal drugs. Some items may interact with your medicine. What should I watch for while using this medicine? This product does not protect you against HIV infection (AIDS) or other sexually transmitted diseases. You should be able to feel the implant by pressing your fingertips over the skin where it was inserted. Contact your doctor if you cannot feel the implant, and use a non-hormonal birth control method (such as condoms) until your doctor confirms that the implant is in place. If you feel that the implant may have broken or become bent while in your arm, contact your healthcare provider. What side effects may I notice from receiving this medicine? Side effects that you should report to your doctor or health care professional as soon as possible: -allergic reactions like skin rash, itching or hives, swelling of the face, lips, or tongue -breast lumps -changes in emotions or moods -depressed mood -heavy or prolonged menstrual bleeding -pain, irritation, swelling, or bruising at the insertion site -scar at site of insertion -signs of infection at the insertion site such as fever, and skin redness, pain or discharge -signs of pregnancy -signs and symptoms of a blood clot such as breathing problems; changes in vision; chest pain; severe, sudden headache; pain, swelling, warmth in the leg; trouble speaking; sudden numbness or weakness of the face, arm or leg -signs and symptoms of liver injury like dark yellow or brown urine; general ill feeling or flu-like symptoms; light-colored stools; loss of appetite; nausea; right upper belly pain; unusually weak or tired; yellowing of the eyes or skin -unusual vaginal bleeding, discharge -signs and symptoms of a stroke like changes in vision; confusion; trouble  speaking or understanding; severe headaches; sudden numbness or weakness of the face, arm or leg; trouble walking; dizziness; loss of balance or coordination Side effects that usually do not require medical attention (report to your doctor or health care professional if they continue or are bothersome): -acne -back pain -breast pain -changes in weight -dizziness -general ill feeling or flu-like symptoms -headache -irregular menstrual bleeding -nausea -sore throat -vaginal irritation or inflammation This list may not describe all possible side effects. Call your doctor for medical advice about side effects. You may report side effects to FDA at 1-800-FDA-1088. Where should I keep my medicine? This drug is given in a hospital or clinic and will not be stored at home. NOTE: This sheet is a summary. It may not cover all possible information. If you have questions about this medicine, talk to your doctor, pharmacist, or health care provider.  2018 Elsevier/Gold Standard (2015-09-18 11:19:22)

## 2016-09-30 NOTE — Progress Notes (Addendum)
   [facility] Visit  Patient name: Sara Foster MRN [id number]  Date of birth: May 20, 1995  CC & HPI:  Sara Foster is a 21 y.o. 701P1001 Caucasian female presenting today for report of bleeding x 1wk, spotting x 1wk, bad pain w/ sex, unable to feel IUD strings. Hasn't checked for strings in a long time. Partner usually complains about being able to feel strings. Pt wants IUD out and nexplanon placed. Wants gc/ct testing. Denies abnormal odor/itching/irritation.  Patient's last menstrual period was 09/20/2016. Mirena IUD placed 03/31/16 The current method of family planning is IUD. Last pap 05/06/14  Pertinent History Reviewed:  Medical & Surgical Hx:   Past medical, surgical, family, and social history reviewed in electronic medical record Medications: Reviewed & Updated - see associated section Allergies: Reviewed in electronic medical record  Objective Findings:  Vitals: BP 122/80 (BP Location: Right Arm, Patient Position: Sitting, Cuff Size: Normal)   Pulse (!) 104   Wt 62.3 kg (137 lb 6.4 oz)   LMP 09/20/2016   BMI 24.34 kg/m  Body mass index is 24.34 kg/m.  Physical Examination: General appearance - alert, well appearing, and in no distress Pelvic - IUD strings NOT visible, small amt white slightly malodorous d/c, gc/ct and wet prep collected IUD in correct fundal placement per informal transvaginal u/s by amber, ultrasonographer  Results for orders placed or performed in visit on 09/30/16 (from the past 24 hour(s))  POCT Wet Prep Mellody Drown(Wet [place])   Collection Time: 09/30/16 10:39 AM  Result Value Ref Range   Source Wet Prep POC vaginal    WBC, Wet Prep HPF POC none    Bacteria Wet Prep HPF POC None (A) Few   BACTERIA WET PREP MORPHOLOGY POC     Clue Cells Wet Prep HPF POC Moderate (A) None   Clue Cells Wet Prep Whiff POC Positive Whiff    Yeast Wet Prep HPF POC None    KOH Wet Prep POC     Trichomonas Wet Prep HPF POC Absent Absent     Assessment &  Plan:  A:   IUD in place  Irregular bleeding/spotting w/ IUD  Cramping/pelvic pain  Dyspareunia  BV  P:  Rx metronidazole 500mg  BID x 7d for BV, no sex or etoh while taking   GC/CT today  Still wants IUD out, ok w/ leaving in place until Nexplanon insertion  Return in about 3 weeks (around 10/21/2016) for IUD removal/nexplanon insertion, order nexplanon today please.  Marge DuncansBooker, Sheldon Amara Randall CNM, [facility]NP[facility] 09/30/2016 10:40 AM

## 2016-10-04 LAB — GC/CHLAMYDIA PROBE AMP
CHLAMYDIA, DNA PROBE: NEGATIVE
Neisseria gonorrhoeae by PCR: NEGATIVE

## 2016-10-21 ENCOUNTER — Ambulatory Visit: Payer: Medicaid Other | Admitting: Women's Health

## 2018-06-05 ENCOUNTER — Other Ambulatory Visit: Payer: Self-pay | Admitting: Advanced Practice Midwife

## 2018-08-25 ENCOUNTER — Encounter: Payer: Self-pay | Admitting: *Deleted

## 2018-08-28 ENCOUNTER — Other Ambulatory Visit: Payer: Self-pay

## 2018-08-28 ENCOUNTER — Other Ambulatory Visit (HOSPITAL_COMMUNITY)
Admission: RE | Admit: 2018-08-28 | Discharge: 2018-08-28 | Disposition: A | Discharge status: Home / Self Care | Payer: Commercial Managed Care - PPO | Source: Ambulatory Visit | Attending: Adult Health | Admitting: Adult Health

## 2018-08-28 ENCOUNTER — Encounter: Payer: Self-pay | Admitting: Adult Health

## 2018-08-28 ENCOUNTER — Ambulatory Visit (INDEPENDENT_AMBULATORY_CARE_PROVIDER_SITE_OTHER): Payer: Medicaid Other | Admitting: Adult Health

## 2018-08-28 VITALS — BP 130/83 | HR 102 | Ht 64.0 in | Wt 150.5 lb

## 2018-08-28 DIAGNOSIS — Z0000 Encounter for general adult medical examination without abnormal findings: Secondary | ICD-10-CM | POA: Diagnosis not present

## 2018-08-28 DIAGNOSIS — Z30432 Encounter for removal of intrauterine contraceptive device: Secondary | ICD-10-CM

## 2018-08-28 DIAGNOSIS — Z3202 Encounter for pregnancy test, result negative: Secondary | ICD-10-CM | POA: Insufficient documentation

## 2018-08-28 DIAGNOSIS — R350 Frequency of micturition: Secondary | ICD-10-CM

## 2018-08-28 DIAGNOSIS — Z3009 Encounter for other general counseling and advice on contraception: Secondary | ICD-10-CM | POA: Diagnosis present

## 2018-08-28 DIAGNOSIS — Z113 Encounter for screening for infections with a predominantly sexual mode of transmission: Secondary | ICD-10-CM

## 2018-08-28 DIAGNOSIS — Z01419 Encounter for gynecological examination (general) (routine) without abnormal findings: Secondary | ICD-10-CM | POA: Diagnosis present

## 2018-08-28 LAB — POCT URINE PREGNANCY: Preg Test, Ur: NEGATIVE

## 2018-08-28 MED ORDER — OXYBUTYNIN CHLORIDE ER 10 MG PO TB24: 10.0000 mg | ORAL_TABLET | Freq: Every day | ORAL | 1 refills | Status: DC

## 2018-08-28 MED ORDER — LO LOESTRIN FE 1 MG-10 MCG / 10 MCG PO TABS: 1.0000 | ORAL_TABLET | Freq: Every day | ORAL | 4 refills | Status: DC

## 2018-08-28 NOTE — Progress Notes (Signed)
Patient ID: Sara Foster, female   DOB: 1995-05-07, 23 y.o.   MRN: [id number] History of Present Illness: Sara Foster is a 23 year old white female, G1P1,single in for a well woman gyn exam and pap and wants her IUD removed. She complains of some cramping and spotting after sex. PCP is [facility].  Current Medications, Allergies, Past Medical History, Past Surgical History, Family History and Social History were reviewed in [employer] record.     Review of Systems: Patient denies any headaches, hearing loss, fatigue, blurred vision, shortness of breath, chest pain, abdominal pain, problems with bowel movements.  No joint pain or mood swings. Has spotting sometimes after sex and cramping on and off.Also has urinary frequency and has seen urology.    Physical Exam:BP 130/83 (BP Location: Left Arm, Patient Position: Sitting, Cuff Size: Normal)   Pulse (!) 102   Ht 5\' 4"  (1.626 m)   Wt 150 lb 8 oz (68.3 kg)   LMP 08/14/2018 (Approximate)   BMI 25.83 kg/m   UPT negative.  Consent signed for IUD removal.  General:  Well developed, well nourished, no acute distress Skin:  Warm and dry,tan Neck:  Midline trachea, normal thyroid, good ROM, no lymphadenopathy Lungs; Clear to auscultation bilaterally Breast:  No dominant palpable mass, retraction, or nipple discharge Cardiovascular: Regular rate and rhythm Abdomen:  Soft, non tender, no hepatosplenomegaly Pelvic:  External genitalia is normal in appearance,skin colored, skin tag top left labia, long standing.  The vagina is normal in appearance. Urethra has no lesions or masses. The cervix is bulbous, IUD strings at os, pap with GC/CHL performed, then pt asked to cough and IUD strings grasped with forceps and easily removed. Marland Kitchen  Uterus is felt to be normal size, shape, and contour,slightly tender.  No adnexal masses or tenderness noted.Bladder is non tender, no masses felt. Extremities/musculoskeletal:  No swelling or  varicosities noted, no clubbing or cyanosis Psych:  No mood changes, alert and cooperative,seems happy Fall risk is low. PHQ 2 score 0. Examination chaperoned by Levy Pupa LPN.  Impression: 1. Encounter for gynecological examination with Papanicolaou smear of cervix   2. Pregnancy examination or test, negative result   3. Family planning   4. Encounter for IUD removal   5. Urinary frequency   6. Screening examination for STD (sexually transmitted disease)       Plan: Pap with GC/CHL and reflex HPV sent Check HIV and RPR Given 1 pack Lo Loestrin to start today and use condoms Meds ordered this encounter  Medications  . Norethindrone-Ethinyl Estradiol-Fe Biphas (LO LOESTRIN FE) 1 MG-10 MCG / 10 MCG tablet    Sig: Take 1 tablet by mouth daily. Take 1 daily by mouth    Dispense:  3 Package    Refill:  4    BIN [id number], PCN [id number], GRP [id number]    Order Specific Question:   Supervising Provider    Answer:   Tania Ade H [[id number]]  . oxybutynin (DITROPAN-XL) 10 MG 24 hr tablet    Sig: Take 1 tablet (10 mg total) by mouth at bedtime.    Dispense:  30 tablet    Refill:  1    Order Specific Question:   Supervising Provider    Answer:   Florian Buff [[id number]]  Will try ditropan for UF/OAB Follow up in 3 months Physical in 1 year Pap in 3 if normal

## 2018-08-29 LAB — HIV ANTIBODY (ROUTINE TESTING W REFLEX): HIV Screen 4th Generation wRfx: NONREACTIVE

## 2018-08-29 LAB — RPR: RPR Ser Ql: NONREACTIVE

## 2018-08-31 LAB — CYTOLOGY - PAP
Chlamydia: NEGATIVE
Diagnosis: NEGATIVE
Neisseria Gonorrhea: NEGATIVE

## 2018-11-28 ENCOUNTER — Ambulatory Visit: Payer: Medicaid Other | Admitting: Women's Health

## 2018-11-28 ENCOUNTER — Other Ambulatory Visit: Payer: Self-pay

## 2019-01-17 DIAGNOSIS — R5383 Other fatigue: Secondary | ICD-10-CM | POA: Diagnosis not present

## 2019-01-17 DIAGNOSIS — F419 Anxiety disorder, unspecified: Secondary | ICD-10-CM | POA: Diagnosis not present

## 2019-01-17 DIAGNOSIS — Z6826 Body mass index (BMI) 26.0-26.9, adult: Secondary | ICD-10-CM | POA: Diagnosis not present

## 2019-02-10 DIAGNOSIS — Z6826 Body mass index (BMI) 26.0-26.9, adult: Secondary | ICD-10-CM | POA: Diagnosis not present

## 2019-02-10 DIAGNOSIS — F419 Anxiety disorder, unspecified: Secondary | ICD-10-CM | POA: Diagnosis not present

## 2019-02-10 DIAGNOSIS — M545 Low back pain: Secondary | ICD-10-CM | POA: Diagnosis not present

## 2019-02-20 DIAGNOSIS — Z6826 Body mass index (BMI) 26.0-26.9, adult: Secondary | ICD-10-CM | POA: Diagnosis not present

## 2019-02-20 DIAGNOSIS — G43909 Migraine, unspecified, not intractable, without status migrainosus: Secondary | ICD-10-CM | POA: Diagnosis not present

## 2019-02-20 DIAGNOSIS — R300 Dysuria: Secondary | ICD-10-CM | POA: Diagnosis not present

## 2019-02-22 DIAGNOSIS — Z20828 Contact with and (suspected) exposure to other viral communicable diseases: Secondary | ICD-10-CM | POA: Diagnosis not present

## 2019-02-22 DIAGNOSIS — G43909 Migraine, unspecified, not intractable, without status migrainosus: Secondary | ICD-10-CM | POA: Diagnosis not present

## 2019-03-26 DIAGNOSIS — H6593 Unspecified nonsuppurative otitis media, bilateral: Secondary | ICD-10-CM | POA: Diagnosis not present

## 2019-03-29 DIAGNOSIS — H6593 Unspecified nonsuppurative otitis media, bilateral: Secondary | ICD-10-CM | POA: Diagnosis not present

## 2019-03-29 DIAGNOSIS — Z20828 Contact with and (suspected) exposure to other viral communicable diseases: Secondary | ICD-10-CM | POA: Diagnosis not present

## 2019-04-20 DIAGNOSIS — F419 Anxiety disorder, unspecified: Secondary | ICD-10-CM | POA: Diagnosis not present

## 2019-04-20 DIAGNOSIS — Z6826 Body mass index (BMI) 26.0-26.9, adult: Secondary | ICD-10-CM | POA: Diagnosis not present

## 2019-05-08 DIAGNOSIS — H6993 Unspecified Eustachian tube disorder, bilateral: Secondary | ICD-10-CM | POA: Diagnosis not present

## 2019-05-08 DIAGNOSIS — Z6829 Body mass index (BMI) 29.0-29.9, adult: Secondary | ICD-10-CM | POA: Diagnosis not present

## 2019-06-06 DIAGNOSIS — N3281 Overactive bladder: Secondary | ICD-10-CM | POA: Diagnosis not present

## 2019-06-06 DIAGNOSIS — F419 Anxiety disorder, unspecified: Secondary | ICD-10-CM | POA: Diagnosis not present

## 2019-06-06 DIAGNOSIS — M545 Low back pain: Secondary | ICD-10-CM | POA: Diagnosis not present

## 2019-06-28 ENCOUNTER — Ambulatory Visit: Payer: Self-pay

## 2019-06-28 ENCOUNTER — Other Ambulatory Visit: Payer: Self-pay

## 2019-06-28 ENCOUNTER — Ambulatory Visit (INDEPENDENT_AMBULATORY_CARE_PROVIDER_SITE_OTHER): Payer: BC Managed Care – PPO | Admitting: Orthopaedic Surgery

## 2019-06-28 ENCOUNTER — Encounter: Payer: Self-pay | Admitting: Orthopaedic Surgery

## 2019-06-28 VITALS — Ht 63.0 in | Wt 160.0 lb

## 2019-06-28 DIAGNOSIS — M542 Cervicalgia: Secondary | ICD-10-CM

## 2019-06-28 DIAGNOSIS — G8929 Other chronic pain: Secondary | ICD-10-CM | POA: Diagnosis not present

## 2019-06-28 DIAGNOSIS — M545 Low back pain: Secondary | ICD-10-CM

## 2019-06-28 NOTE — Progress Notes (Signed)
Office Visit Note   Patient: Sara Foster           Date of Birth: 27-Jul-1995           MRN: [id number] Visit Date: 06/28/2019              Requested by: [facility], [facility] [address] [place],  [place] [address] PCP: [facility], [facility]   Assessment & Plan: Visit Diagnoses:  1. Neck pain   2. Chronic bilateral low back pain, unspecified whether sciatica present     Plan: We will proceed with some physical therapy for treatment of her neck and low back symptoms not responsive to anti-inflammatories.  Recheck 4 weeks.  Follow-Up Instructions: Return in about 4 weeks (around 07/26/2019).   Orders:  Orders Placed This Encounter  Procedures  . XR Cervical Spine 2 or 3 views  . XR Lumbar Spine 2-3 Views  . Ambulatory referral to Physical Therapy   No orders of the defined types were placed in this encounter.     Procedures: No procedures performed   Clinical Data: No additional findings.   Subjective: Chief Complaint  Patient presents with  . Neck - Pain  . Lower Back - Pain    HPI 24 year old female works at [employer] with problems with neck and low back pain.  She denies any associated bowel or bladder symptoms.  She has had pain in her neck that radiates into her shoulders.  Review of Systems negative is a pertains HPI all systems reviewed.   Objective: Vital Signs: Ht 5\' 3"  (1.6 m)   Wt 160 lb (72.6 kg)   BMI 28.34 kg/m   Physical Exam Constitutional:      Appearance: She is well-developed.  HENT:     Head: Normocephalic.     Right Ear: External ear normal.     Left Ear: External ear normal.  Eyes:     Pupils: Pupils are equal, round, and reactive to light.  Neck:     Thyroid: No thyromegaly.     Trachea: No tracheal deviation.  Cardiovascular:     Rate and Rhythm: Normal rate.  Pulmonary:     Effort: Pulmonary effort is normal.  Abdominal:     Palpations: Abdomen is soft.  Skin:    General: Skin is warm and  dry.  Neurological:     Mental Status: She is alert and oriented to person, place, and time.  Psychiatric:        Behavior: Behavior normal.     Ortho Exam patient has some brachial plexus tenderness both right and left.  Some prominence on the right of the clavicular head of the sternocleidomastoid.,  Nontender.  Upper extremity reflexes 2+ symmetrical normal heel toe gait some tenderness lumbosacral junction no sciatic notch tenderness trochanteric bursa is normal knee and ankle jerk are symmetrical and intact.  No isolated motor weakness.  Specialty Comments:  No specialty comments available.  Imaging: P lateral lumbar spine x-rays are obtained and reviewed.  This shows S1-S2 disc.  Negative for acute changes no significant facet changes no congenital bars no spondylolisthesis.  Impression: Transitional anatomy otherwise normal lumbar spine radiographs.  P lateral cervical spine x-rays are obtained and reviewed.  This shows minimal narrowing C5-6 slight reversal of curve mid cervical region.  No spondylolisthesis.  No acute changes are noted.  Impression: Nonspecific slight loss of mid cervical lordosis otherwise normal radiographs.   PMFS History: Patient Active Problem List  Diagnosis Date Noted  . Urinary frequency 08/28/2018  . Encounter for IUD removal 08/28/2018  . Family planning 08/28/2018  . Encounter for gynecological examination with Papanicolaou smear of cervix 08/28/2018  . Pregnancy examination or test, negative result 08/28/2018  . Encounter for insertion of mirena IUD 04/01/2015  . OAB (overactive bladder) 05/06/2014  . Accessory breast in female, bilateral 12/14/2013  . Neisseria gonorrheae 11/20/2013  . HSV-2 infection complicating pregnancy 37/12/6267  . Rubella non-immune status, antepartum 05/03/2013  . Chlamydia infection complicating pregnancy in first trimester 05/03/2013  . Leg weakness 01/05/2011  . Neck muscle weakness 01/05/2011  . Scoliosis  12/31/2010   Past Medical History:  Diagnosis Date  . Chlamydia 2015  . Encounter for education about contraceptive use 03/24/2015  . Generalized anxiety disorder   . Gonorrhea 2015  . Low back pain   . Migraines   . OAB (overactive bladder) 05/06/2014    Family History  Problem Relation Age of Onset  . Asthma Mother   . Lung disease Father   . Arthritis Maternal Grandmother   . Lung disease Maternal Grandfather   . Heart disease Paternal Grandfather   . Cancer Other        maternal great grandma  . Migraines Other     Past Surgical History:  Procedure Laterality Date  . WISDOM TOOTH EXTRACTION     Social History   Occupational History  . Not on file  Tobacco Use  . Smoking status: Never Smoker  . Smokeless tobacco: Never Used  Substance and Sexual Activity  . Alcohol use: Yes    Comment: very rarely  . Drug use: No  . Sexual activity: Yes

## 2019-07-06 DIAGNOSIS — S134XXA Sprain of ligaments of cervical spine, initial encounter: Secondary | ICD-10-CM | POA: Diagnosis not present

## 2019-07-06 DIAGNOSIS — M545 Low back pain: Secondary | ICD-10-CM | POA: Diagnosis not present

## 2019-07-09 DIAGNOSIS — S134XXA Sprain of ligaments of cervical spine, initial encounter: Secondary | ICD-10-CM | POA: Diagnosis not present

## 2019-07-09 DIAGNOSIS — M545 Low back pain: Secondary | ICD-10-CM | POA: Diagnosis not present

## 2019-07-16 DIAGNOSIS — R41840 Attention and concentration deficit: Secondary | ICD-10-CM | POA: Diagnosis not present

## 2019-07-16 DIAGNOSIS — N3281 Overactive bladder: Secondary | ICD-10-CM | POA: Diagnosis not present

## 2019-07-16 DIAGNOSIS — F419 Anxiety disorder, unspecified: Secondary | ICD-10-CM | POA: Diagnosis not present

## 2019-07-16 DIAGNOSIS — M545 Low back pain: Secondary | ICD-10-CM | POA: Diagnosis not present

## 2019-07-23 DIAGNOSIS — S134XXA Sprain of ligaments of cervical spine, initial encounter: Secondary | ICD-10-CM | POA: Diagnosis not present

## 2019-07-23 DIAGNOSIS — M545 Low back pain: Secondary | ICD-10-CM | POA: Diagnosis not present

## 2019-07-26 ENCOUNTER — Ambulatory Visit (INDEPENDENT_AMBULATORY_CARE_PROVIDER_SITE_OTHER): Payer: BC Managed Care – PPO | Admitting: Orthopaedic Surgery

## 2019-07-26 ENCOUNTER — Other Ambulatory Visit: Payer: Self-pay

## 2019-07-26 ENCOUNTER — Telehealth: Payer: Self-pay

## 2019-07-26 DIAGNOSIS — G8929 Other chronic pain: Secondary | ICD-10-CM | POA: Diagnosis not present

## 2019-07-26 DIAGNOSIS — M545 Low back pain: Secondary | ICD-10-CM | POA: Diagnosis not present

## 2019-07-26 NOTE — Telephone Encounter (Signed)
Can you please document on MRI order that was placed today that patient was scheduled at [facility] for her MRI scan on 05/26 @ 330pm Patient is aware of appointment date and time.

## 2019-07-26 NOTE — Telephone Encounter (Signed)
noted 

## 2019-07-26 NOTE — Progress Notes (Signed)
Office Visit Note   Patient: Sara Foster           Date of Birth: 1996-01-15           MRN: [id number] Visit Date: 07/26/2019              Requested by: Practice, [address] [place],  [place] [address] PCP: [facility]   Assessment & Plan: Visit Diagnoses: No diagnosis found.  Plan: Due to ongoing symptoms for several months with failure of treatment response with muscle relaxants, anti-inflammatories, therapy exercises, Medrol Dosepak, activity modification I would recommend proceeding with an MRI scan lumbar spine for evaluation of chronic midline low back pain.  Office follow-up after scan for review.  Follow-Up Instructions: after MRI  Orders:  No orders of the defined types were placed in this encounter.  No orders of the defined types were placed in this encounter.     Procedures: No procedures performed   Clinical Data: No additional findings.   Subjective: Chief Complaint  Patient presents with  . Lower Back - Pain, Follow-up  . Neck - Follow-up, Pain    HPI 24 year old female works at [employer] is here with ongoing problems with more back pain that tends to radiate up to her neck.  She states she is doing about the same she is been treated with anti-inflammatories muscle relaxants without relief. Patient's been on Xanax, Adderall also Cymbalta.  She is taken prednisone Dosepak.  Over-the-counter anti-inflammatories as well as Mobic without relief and muscle relaxants including Zanaflex.  Patient symptoms bother her daily at work.  Review of Systems all other systems negative as pertains to HPI.   Objective: Vital Signs: There were no vitals taken for this visit.  Physical Exam Constitutional:      Appearance: She is well-developed.  HENT:     Head: Normocephalic.     Right Ear: External ear normal.     Left Ear: External ear normal.  Eyes:     Pupils: Pupils are equal, round, and reactive to light.  Neck:     Thyroid: No thyromegaly.     Trachea: No tracheal deviation.  Cardiovascular:     Rate and Rhythm: Normal rate.  Pulmonary:     Effort: Pulmonary effort is normal.  Abdominal:     Palpations: Abdomen is soft.  Skin:    General: Skin is warm and dry.  Neurological:     Mental Status: She is alert and oriented to person, place, and time.  Psychiatric:        Behavior: Behavior normal.     Ortho Exam patient has good cervical range of motion negative Spurling.  Some tenderness over the clavicular heads of the sternocleido month mastoid more on the right than left.  Upper and lower extremity red reflexes are 2+.  No sciatic notch tenderness.  Anterior tib gastrocsoleus is intact.  Increased discomfort with lumbar flexion extension with pain with palpation L3-S1 1 paralumbar muscles.  Anterior tib EHL is intact.  Specialty Comments:  No specialty comments available.  Imaging: No results found.   PMFS History: Patient Active Problem List   Diagnosis Date Noted  . Urinary frequency 08/28/2018  . Encounter for IUD removal 08/28/2018  . Family planning 08/28/2018  . Encounter for gynecological examination with Papanicolaou smear of cervix 08/28/2018  . Pregnancy examination or test, negative result 08/28/2018  . Encounter for insertion of mirena IUD 04/01/2015  . OAB (overactive bladder) 05/06/2014  .  Accessory breast in female, bilateral 12/14/2013  . Neisseria gonorrheae 11/20/2013  . HSV-2 infection complicating pregnancy 10/31/2013  . Rubella non-immune status, antepartum 05/03/2013  . Chlamydia infection complicating pregnancy in first trimester 05/03/2013  . Leg weakness 01/05/2011  . Neck muscle weakness 01/05/2011  . Scoliosis 12/31/2010   Past Medical History:  Diagnosis Date  . Chlamydia 2015  . Encounter for education about contraceptive use 03/24/2015  . Generalized anxiety disorder   . Gonorrhea 2015  . Low back pain   . Migraines   . OAB (overactive bladder)  05/06/2014    Family History  Problem Relation Age of Onset  . Asthma Mother   . Lung disease Father   . Arthritis Maternal Grandmother   . Lung disease Maternal Grandfather   . Heart disease Paternal Grandfather   . Cancer Other        maternal great grandma  . Migraines Other     Past Surgical History:  Procedure Laterality Date  . WISDOM TOOTH EXTRACTION     Social History   Occupational History  . Not on file  Tobacco Use  . Smoking status: Never Smoker  . Smokeless tobacco: Never Used  Substance and Sexual Activity  . Alcohol use: Yes    Comment: very rarely  . Drug use: No  . Sexual activity: Yes

## 2019-08-03 DIAGNOSIS — Z3202 Encounter for pregnancy test, result negative: Secondary | ICD-10-CM | POA: Diagnosis not present

## 2019-08-04 DIAGNOSIS — Z3202 Encounter for pregnancy test, result negative: Secondary | ICD-10-CM | POA: Diagnosis not present

## 2019-08-04 DIAGNOSIS — R110 Nausea: Secondary | ICD-10-CM | POA: Diagnosis not present

## 2019-08-06 DIAGNOSIS — Z23 Encounter for immunization: Secondary | ICD-10-CM | POA: Diagnosis not present

## 2019-08-08 ENCOUNTER — Ambulatory Visit: Payer: Medicaid Other | Admitting: Urology

## 2019-08-09 ENCOUNTER — Ambulatory Visit: Payer: BC Managed Care – PPO | Admitting: Orthopaedic Surgery

## 2019-08-09 ENCOUNTER — Other Ambulatory Visit: Payer: Self-pay

## 2019-09-05 DIAGNOSIS — K5900 Constipation, unspecified: Secondary | ICD-10-CM | POA: Diagnosis not present

## 2019-09-06 DIAGNOSIS — Z23 Encounter for immunization: Secondary | ICD-10-CM | POA: Diagnosis not present

## 2019-10-05 DIAGNOSIS — Z6826 Body mass index (BMI) 26.0-26.9, adult: Secondary | ICD-10-CM | POA: Diagnosis not present

## 2019-10-05 DIAGNOSIS — G43009 Migraine without aura, not intractable, without status migrainosus: Secondary | ICD-10-CM | POA: Diagnosis not present

## 2019-10-12 DIAGNOSIS — R41840 Attention and concentration deficit: Secondary | ICD-10-CM | POA: Diagnosis not present

## 2019-10-12 DIAGNOSIS — Z6828 Body mass index (BMI) 28.0-28.9, adult: Secondary | ICD-10-CM | POA: Diagnosis not present

## 2019-10-12 DIAGNOSIS — F419 Anxiety disorder, unspecified: Secondary | ICD-10-CM | POA: Diagnosis not present

## 2019-10-12 DIAGNOSIS — Z1331 Encounter for screening for depression: Secondary | ICD-10-CM | POA: Diagnosis not present

## 2019-10-27 DIAGNOSIS — Z20828 Contact with and (suspected) exposure to other viral communicable diseases: Secondary | ICD-10-CM | POA: Diagnosis not present

## 2019-11-09 DIAGNOSIS — E559 Vitamin D deficiency, unspecified: Secondary | ICD-10-CM | POA: Diagnosis not present

## 2019-11-09 DIAGNOSIS — Z13220 Encounter for screening for lipoid disorders: Secondary | ICD-10-CM | POA: Diagnosis not present

## 2019-11-09 DIAGNOSIS — R5382 Chronic fatigue, unspecified: Secondary | ICD-10-CM | POA: Diagnosis not present

## 2019-11-09 DIAGNOSIS — Z0000 Encounter for general adult medical examination without abnormal findings: Secondary | ICD-10-CM | POA: Diagnosis not present

## 2019-11-13 DIAGNOSIS — Z0000 Encounter for general adult medical examination without abnormal findings: Secondary | ICD-10-CM | POA: Diagnosis not present

## 2019-12-11 DIAGNOSIS — Z20828 Contact with and (suspected) exposure to other viral communicable diseases: Secondary | ICD-10-CM | POA: Diagnosis not present

## 2019-12-15 DIAGNOSIS — Z20828 Contact with and (suspected) exposure to other viral communicable diseases: Secondary | ICD-10-CM | POA: Diagnosis not present

## 2019-12-17 DIAGNOSIS — Z20828 Contact with and (suspected) exposure to other viral communicable diseases: Secondary | ICD-10-CM | POA: Diagnosis not present

## 2019-12-17 DIAGNOSIS — Z20822 Contact with and (suspected) exposure to covid-19: Secondary | ICD-10-CM | POA: Diagnosis not present

## 2019-12-17 DIAGNOSIS — R059 Cough, unspecified: Secondary | ICD-10-CM | POA: Diagnosis not present

## 2020-01-10 DIAGNOSIS — F419 Anxiety disorder, unspecified: Secondary | ICD-10-CM | POA: Diagnosis not present

## 2020-01-10 DIAGNOSIS — N920 Excessive and frequent menstruation with regular cycle: Secondary | ICD-10-CM | POA: Diagnosis not present

## 2020-01-10 DIAGNOSIS — R41840 Attention and concentration deficit: Secondary | ICD-10-CM | POA: Diagnosis not present

## 2020-01-17 DIAGNOSIS — N912 Amenorrhea, unspecified: Secondary | ICD-10-CM | POA: Diagnosis not present

## 2020-02-11 DIAGNOSIS — S060X0A Concussion without loss of consciousness, initial encounter: Secondary | ICD-10-CM | POA: Diagnosis not present

## 2020-02-13 DIAGNOSIS — S060X0D Concussion without loss of consciousness, subsequent encounter: Secondary | ICD-10-CM | POA: Diagnosis not present

## 2020-02-15 DIAGNOSIS — S060X0D Concussion without loss of consciousness, subsequent encounter: Secondary | ICD-10-CM | POA: Diagnosis not present

## 2020-02-28 DIAGNOSIS — R300 Dysuria: Secondary | ICD-10-CM | POA: Diagnosis not present

## 2020-03-15 NOTE — L&D Delivery Note (Signed)
Delivery Note Progressed quickly to complete dilation and pushed well.  At 8:14 AM a viable and healthy female was delivered via Vaginal, Spontaneous (Presentation:  LOA ).  APGAR: 8, 9; weight  .   Placenta status: Spontaneous;Retained, Intact.  Cord: 3 vessels with the following complications: None.    Anesthesia: Epidural Episiotomy: None Lacerations: None except abrasion to left labia Suture Repair:  none Est. Blood Loss (mL):    Mom to postpartum.  Baby to Couplet care / Skin to Skin.  [name] 02/18/2021, 8:45 AM   Please schedule this patient for Postpartum visit in: 1 week with the following provider: Any provider In-Person For C/S patients schedule nurse incision check in weeks 2 weeks: no High risk pregnancy complicated by: HTN developed in August (23 weeks) Delivery mode:  SVD Anticipated Birth Control:  IUD PP Procedures needed: BP check  Edinburgh: negative Schedule Integrated BH visit: no  No relevant baby issues

## 2020-06-24 ENCOUNTER — Other Ambulatory Visit: Payer: BC Managed Care – PPO

## 2020-06-25 ENCOUNTER — Encounter: Payer: Self-pay | Admitting: *Deleted

## 2020-06-25 ENCOUNTER — Other Ambulatory Visit (INDEPENDENT_AMBULATORY_CARE_PROVIDER_SITE_OTHER): Payer: Self-pay | Admitting: *Deleted

## 2020-06-25 ENCOUNTER — Other Ambulatory Visit: Payer: Self-pay

## 2020-06-25 VITALS — BP 128/76 | HR 96 | Ht 63.0 in | Wt 132.0 lb

## 2020-06-25 DIAGNOSIS — Z3201 Encounter for pregnancy test, result positive: Secondary | ICD-10-CM

## 2020-06-25 DIAGNOSIS — N926 Irregular menstruation, unspecified: Secondary | ICD-10-CM

## 2020-06-25 LAB — POCT URINE PREGNANCY: Preg Test, Ur: POSITIVE — AB

## 2020-06-25 NOTE — Progress Notes (Signed)
   NURSE VISIT- PREGNANCY CONFIRMATION   SUBJECTIVE:  Sara Foster is a 25 y.o. G62P1001 female at [redacted]w[redacted]d by certain LMP of Patient's last menstrual period was 05/26/2020 (exact date). Here for pregnancy confirmation.  Home pregnancy test: positive x 2  She reports no complaints.  She is taking prenatal vitamins.    OBJECTIVE:  BP 128/76 (BP Location: Left Arm, Patient Position: Sitting, Cuff Size: Normal)   Pulse 96   Ht 5\' 3"  (1.6 m)   Wt 132 lb (59.9 kg)   LMP 05/26/2020 (Exact Date)   BMI 23.38 kg/m   Appears well, in no apparent distress OB History  Gravida Para Term Preterm AB Living  2 1 1     1   SAB IAB Ectopic Multiple Live Births          1    # Outcome Date GA Lbr Len/2nd Weight Sex Delivery Anes PTL Lv  2 Current           1 Term 12/09/13 [redacted]w[redacted]d 14:05 / 02:26 7 lb 6.3 oz (3.355 kg) F Vag-Spont EPI  LIV    Results for orders placed or performed in visit on 06/25/20 (from the past 24 hour(s))  POCT urine pregnancy   Collection Time: 06/25/20 11:19 AM  Result Value Ref Range   Preg Test, Ur Positive (A) Negative    ASSESSMENT: Positive pregnancy test, [redacted]w[redacted]d by LMP    PLAN: Schedule for dating ultrasound in 3-4 weeks Prenatal vitamins: continue   Nausea medicines: not currently needed   OB packet given: Yes   Pt advised to discuss weaning off of Cymbalta with PCP, she agrees and will be seeing them later this week   06/27/20  06/25/2020 11:22 AM

## 2020-06-25 NOTE — Progress Notes (Signed)
Chart reviewed for nurse visit. Agree with plan of care.  Adline Potter, NP 06/25/2020 1:16 PM

## 2020-07-22 ENCOUNTER — Other Ambulatory Visit: Payer: Self-pay | Admitting: Obstetrics & Gynecology

## 2020-07-22 DIAGNOSIS — O3680X0 Pregnancy with inconclusive fetal viability, not applicable or unspecified: Secondary | ICD-10-CM

## 2020-07-23 ENCOUNTER — Other Ambulatory Visit: Payer: Self-pay

## 2020-07-23 ENCOUNTER — Ambulatory Visit (INDEPENDENT_AMBULATORY_CARE_PROVIDER_SITE_OTHER): Payer: Self-pay

## 2020-07-23 DIAGNOSIS — O3680X0 Pregnancy with inconclusive fetal viability, not applicable or unspecified: Secondary | ICD-10-CM

## 2020-07-23 DIAGNOSIS — Z3A08 8 weeks gestation of pregnancy: Secondary | ICD-10-CM

## 2020-07-23 NOTE — Progress Notes (Signed)
[name] 8+2 wks,single IUP with ys,positive fht 173 bpm,normal ovaries,crl 20.8 mm

## 2020-08-21 ENCOUNTER — Other Ambulatory Visit: Payer: Self-pay | Admitting: Obstetrics & Gynecology

## 2020-08-21 DIAGNOSIS — Z3682 Encounter for antenatal screening for nuchal translucency: Secondary | ICD-10-CM

## 2020-08-22 ENCOUNTER — Encounter: Payer: Self-pay | Admitting: Women's Health

## 2020-08-22 ENCOUNTER — Ambulatory Visit (INDEPENDENT_AMBULATORY_CARE_PROVIDER_SITE_OTHER): Payer: Medicaid Other | Admitting: Women's Health

## 2020-08-22 ENCOUNTER — Other Ambulatory Visit: Payer: Self-pay

## 2020-08-22 ENCOUNTER — Ambulatory Visit (INDEPENDENT_AMBULATORY_CARE_PROVIDER_SITE_OTHER): Payer: Medicaid Other

## 2020-08-22 ENCOUNTER — Ambulatory Visit: Payer: Medicaid Other | Admitting: *Deleted

## 2020-08-22 VITALS — BP 113/69 | HR 92 | Wt 148.0 lb

## 2020-08-22 DIAGNOSIS — Z3480 Encounter for supervision of other normal pregnancy, unspecified trimester: Secondary | ICD-10-CM

## 2020-08-22 DIAGNOSIS — Z3682 Encounter for antenatal screening for nuchal translucency: Secondary | ICD-10-CM

## 2020-08-22 DIAGNOSIS — Z3A12 12 weeks gestation of pregnancy: Secondary | ICD-10-CM

## 2020-08-22 DIAGNOSIS — F418 Other specified anxiety disorders: Secondary | ICD-10-CM

## 2020-08-22 DIAGNOSIS — Z3490 Encounter for supervision of normal pregnancy, unspecified, unspecified trimester: Secondary | ICD-10-CM | POA: Insufficient documentation

## 2020-08-22 DIAGNOSIS — Z3481 Encounter for supervision of other normal pregnancy, first trimester: Secondary | ICD-10-CM

## 2020-08-22 LAB — POCT URINALYSIS DIPSTICK OB
Blood, UA: NEGATIVE
Glucose, UA: NEGATIVE
Ketones, UA: NEGATIVE
Leukocytes, UA: NEGATIVE
Nitrite, UA: NEGATIVE
POC,PROTEIN,UA: NEGATIVE

## 2020-08-22 NOTE — Progress Notes (Signed)
[name] 12+4 wks,measurements c/w dates,CRL 68.79 mm,NT 1.6 mm,NB present,normal ovaries,fhr 150 bpm,anterior placenta

## 2020-08-22 NOTE — Patient Instructions (Signed)
Sara Foster, thank you for choosing our office today! We appreciate the opportunity to meet your healthcare needs. You may receive a short survey by mail, e-mail, or through [employer]. If you are happy with your care we would appreciate if you could take just a few minutes to complete the survey questions. We read all of your comments and take your feedback very seriously. Thank you again for choosing our office.  [facility] [employer] [facility] at [facility]  [facility] ([address] [place], [place] [address]) Entrance C, located off of [address] Free 24/7 valet parking   Nausea & Vomiting Have saltine crackers or pretzels by your bed and eat a few bites before you raise your head out of bed in the morning Eat small frequent meals throughout the day instead of large meals Drink plenty of fluids throughout the day to stay hydrated, just don't drink a lot of fluids with your meals.  This can make your stomach fill up faster making you feel sick Do not brush your teeth right after you eat Products with real ginger are good for nausea, like ginger ale and ginger hard candy Make sure it says made with real ginger! Sucking on sour candy like lemon heads is also good for nausea If your prenatal vitamins make you nauseated, take them at night so you will sleep through the nausea Sea Bands If you feel like you need medicine for the nausea & vomiting please let us know If you are unable to keep any fluids or food down please let us know   Constipation Drink plenty of fluid, preferably water, throughout the day Eat foods high in fiber such as fruits, vegetables, and grains Exercise, such as walking, is a good way to keep your bowels regular Drink warm fluids, especially warm prune juice, or decaf coffee Eat a 1/2 cup of real oatmeal (not instant), 1/2 cup applesauce, and 1/2-1 cup warm prune juice every day If needed, you may take Colace (docusate sodium) stool softener  once or twice a day to help keep the stool soft.  If you still are having problems with constipation, you may take Miralax once daily as needed to help keep your bowels regular.   Home Blood Pressure Monitoring for Patients   Your provider has recommended that you check your blood pressure (BP) at least once a week at home. If you do not have a blood pressure cuff at home, one will be provided for you. Contact your provider if you have not received your monitor within 1 week.   Helpful Tips for Accurate Home Blood Pressure Checks  Don't smoke, exercise, or drink caffeine 30 minutes before checking your BP Use the restroom before checking your BP (a full bladder can raise your pressure) Relax in a comfortable upright chair Feet on the ground Left arm resting comfortably on a flat surface at the level of your heart Legs uncrossed Back supported Sit quietly and don't talk Place the cuff on your bare arm Adjust snuggly, so that only two fingertips can fit between your skin and the top of the cuff Check 2 readings separated by at least one minute Keep a log of your BP readings For a visual, please reference this diagram: http://ccnc.care/bpdiagram  Provider Name: [facility]     Phone: [phone]  Zone 1: ALL CLEAR  Continue to monitor your symptoms:  BP reading is less than 140 (top number) or less than 90 (bottom  number)  No right upper stomach pain No headaches or seeing spots No feeling nauseated or throwing up No swelling in face and hands  Zone 2: CAUTION Call your doctor's office for any of the following:  BP reading is greater than 140 (top number) or greater than 90 (bottom number)  Stomach pain under your ribs in the middle or right side Headaches or seeing spots Feeling nauseated or throwing up Swelling in face and hands  Zone 3: EMERGENCY  Seek immediate medical care if you have any of the following:  BP reading is greater than160 (top number) or greater than  110 (bottom number) Severe headaches not improving with Tylenol Serious difficulty catching your breath Any worsening symptoms from Zone 2    First Trimester of Pregnancy The first trimester of pregnancy is from week 1 until the end of week 12 (months 1 through 3). A week after a sperm fertilizes an egg, the egg will implant on the wall of the uterus. This embryo will begin to develop into a baby. Genes from you and your partner are forming the baby. The female genes determine whether the baby is a boy or a girl. At 6-8 weeks, the eyes and face are formed, and the heartbeat can be seen on ultrasound. At the end of 12 weeks, all the baby's organs are formed.  Now that you are pregnant, you will want to do everything you can to have a healthy baby. Two of the most important things are to get good prenatal care and to follow your health care provider's instructions. Prenatal care is all the medical care you receive before the baby's birth. This care will help prevent, find, and treat any problems during the pregnancy and childbirth. BODY CHANGES Your body goes through many changes during pregnancy. The changes vary from woman to woman.  You may gain or lose a couple of pounds at first. You may feel sick to your stomach (nauseous) and throw up (vomit). If the vomiting is uncontrollable, call your health care provider. You may tire easily. You may develop headaches that can be relieved by medicines approved by your health care provider. You may urinate more often. Painful urination may mean you have a bladder infection. You may develop heartburn as a result of your pregnancy. You may develop constipation because certain hormones are causing the muscles that push waste through your intestines to slow down. You may develop hemorrhoids or swollen, bulging veins (varicose veins). Your breasts may begin to grow larger and become tender. Your nipples may stick out more, and the tissue that surrounds them  (areola) may become darker. Your gums may bleed and may be sensitive to brushing and flossing. Dark spots or blotches (chloasma, mask of pregnancy) may develop on your face. This will likely fade after the baby is born. Your menstrual periods will stop. You may have a loss of appetite. You may develop cravings for certain kinds of food. You may have changes in your emotions from day to day, such as being excited to be pregnant or being concerned that something may go wrong with the pregnancy and baby. You may have more vivid and strange dreams. You may have changes in your hair. These can include thickening of your hair, rapid growth, and changes in texture. Some women also have hair loss during or after pregnancy, or hair that feels dry or thin. Your hair will most likely return to normal after your baby is born. WHAT TO EXPECT AT YOUR PRENATAL  VISITS During a routine prenatal visit: You will be weighed to make sure you and the baby are growing normally. Your blood pressure will be taken. Your abdomen will be measured to track your baby's growth. The fetal heartbeat will be listened to starting around week 10 or 12 of your pregnancy. Test results from any previous visits will be discussed. Your health care provider may ask you: How you are feeling. If you are feeling the baby move. If you have had any abnormal symptoms, such as leaking fluid, bleeding, severe headaches, or abdominal cramping. If you have any questions. Other tests that may be performed during your first trimester include: Blood tests to find your blood type and to check for the presence of any previous infections. They will also be used to check for low iron levels (anemia) and Rh antibodies. Later in the pregnancy, blood tests for diabetes will be done along with other tests if problems develop. Urine tests to check for infections, diabetes, or protein in the urine. An ultrasound to confirm the proper growth and development  of the baby. An amniocentesis to check for possible genetic problems. Fetal screens for spina bifida and Down syndrome. You may need other tests to make sure you and the baby are doing well. HOME CARE INSTRUCTIONS  Medicines Follow your health care provider's instructions regarding medicine use. Specific medicines may be either safe or unsafe to take during pregnancy. Take your prenatal vitamins as directed. If you develop constipation, try taking a stool softener if your health care provider approves. Diet Eat regular, well-balanced meals. Choose a variety of foods, such as meat or vegetable-based protein, fish, milk and low-fat dairy products, vegetables, fruits, and whole grain breads and cereals. Your health care provider will help you determine the amount of weight gain that is right for you. Avoid raw meat and uncooked cheese. These carry germs that can cause birth defects in the baby. Eating four or five small meals rather than three large meals a day may help relieve nausea and vomiting. If you start to feel nauseous, eating a few soda crackers can be helpful. Drinking liquids between meals instead of during meals also seems to help nausea and vomiting. If you develop constipation, eat more high-fiber foods, such as fresh vegetables or fruit and whole grains. Drink enough fluids to keep your urine clear or pale yellow. Activity and Exercise Exercise only as directed by your health care provider. Exercising will help you: Control your weight. Stay in shape. Be prepared for labor and delivery. Experiencing pain or cramping in the lower abdomen or low back is a good sign that you should stop exercising. Check with your health care provider before continuing normal exercises. Try to avoid standing for long periods of time. Move your legs often if you must stand in one place for a long time. Avoid heavy lifting. Wear low-heeled shoes, and practice good posture. You may continue to have sex  unless your health care provider directs you otherwise. Relief of Pain or Discomfort Wear a good support bra for breast tenderness.   Take warm sitz baths to soothe any pain or discomfort caused by hemorrhoids. Use hemorrhoid cream if your health care provider approves.   Rest with your legs elevated if you have leg cramps or low back pain. If you develop varicose veins in your legs, wear support hose. Elevate your feet for 15 minutes, 3-4 times a day. Limit salt in your diet. Prenatal Care Schedule your prenatal visits by the  twelfth week of pregnancy. They are usually scheduled monthly at first, then more often in the last 2 months before delivery. Write down your questions. Take them to your prenatal visits. Keep all your prenatal visits as directed by your health care provider. Safety Wear your seat belt at all times when driving. Make a list of emergency phone numbers, including numbers for family, friends, the hospital, and police and fire departments. General Tips Ask your health care provider for a referral to a local prenatal education class. Begin classes no later than at the beginning of month 6 of your pregnancy. Ask for help if you have counseling or nutritional needs during pregnancy. Your health care provider can offer advice or refer you to specialists for help with various needs. Do not use hot tubs, steam rooms, or saunas. Do not douche or use tampons or scented sanitary pads. Do not cross your legs for long periods of time. Avoid cat litter boxes and soil used by cats. These carry germs that can cause birth defects in the baby and possibly loss of the fetus by miscarriage or stillbirth. Avoid all smoking, herbs, alcohol, and medicines not prescribed by your health care provider. Chemicals in these affect the formation and growth of the baby. Schedule a dentist appointment. At home, brush your teeth with a soft toothbrush and be gentle when you floss. SEEK MEDICAL CARE IF:   You have dizziness. You have mild pelvic cramps, pelvic pressure, or nagging pain in the abdominal area. You have persistent nausea, vomiting, or diarrhea. You have a bad smelling vaginal discharge. You have pain with urination. You notice increased swelling in your face, hands, legs, or ankles. SEEK IMMEDIATE MEDICAL CARE IF:  You have a fever. You are leaking fluid from your vagina. You have spotting or bleeding from your vagina. You have severe abdominal cramping or pain. You have rapid weight gain or loss. You vomit blood or material that looks like coffee grounds. You are exposed to [name] measles and have never had them. You are exposed to fifth disease or chickenpox. You develop a severe headache. You have shortness of breath. You have any kind of trauma, such as from a fall or a car accident. Document Released: 02/23/2001 Document Revised: 07/16/2013 Document Reviewed: 01/09/2013 [facility] Patient Information 2015 [place], [place]. This information is not intended to replace advice given to you by your health care provider. Make sure you discuss any questions you have with your health care provider.

## 2020-08-22 NOTE — Progress Notes (Signed)
INITIAL OBSTETRICAL VISIT Patient name: Sara Foster MRN [id number]  Date of birth: March 25, 1995 Chief Complaint:   Initial Prenatal Visit  History of Present Illness:   Sara Foster is a 25 y.o. G79P1001 Caucasian female at [redacted]w[redacted]d by LMP c/w u/s at 8 weeks with an Estimated Date of Delivery: 03/02/21 being seen today for her initial obstetrical visit.   Patient's last menstrual period was 05/26/2020 (exact date). Her obstetrical history is significant for  term uncomplicated SVB x 1 .   Today she reports  n/v much improved. Dep/anx (mostly anxiety), was on cymbalta, xanax and adderall prior to pregnancy- quit all, was very hard, feels she is having a lot of anxiety now .  Last pap 08/28/18. Results were: NILM w/ HRHPV not done  Depression screen [facility] 2/9 08/22/2020 08/28/2018  Decreased Interest 3 0  Down, Depressed, Hopeless 2 0  PHQ - 2 Score 5 0  Altered sleeping 3 -  Tired, decreased energy 3 -  Change in appetite 2 -  Feeling bad or failure about yourself  1 -  Trouble concentrating 3 -  Moving slowly or fidgety/restless 2 -  Suicidal thoughts 0 -  PHQ-9 Score 19 -     GAD 7 : Generalized Anxiety Score 08/22/2020  Nervous, Anxious, on Edge 3  Control/stop worrying 2  Worry too much - different things 3  Trouble relaxing 3  Restless 2  Easily annoyed or irritable 3  Afraid - awful might happen 0  Total GAD 7 Score 16     Review of Systems:   Pertinent items are noted in HPI Denies cramping/contractions, leakage of fluid, vaginal bleeding, abnormal vaginal discharge w/ itching/odor/irritation, headaches, visual changes, shortness of breath, chest pain, abdominal pain, severe nausea/vomiting, or problems with urination or bowel movements unless otherwise stated above.  Pertinent History Reviewed:  Reviewed past medical,surgical, social, obstetrical and family history.  Reviewed problem list, medications and allergies. OB History  Gravida Para Term Preterm AB  Living  2 1 1     1   SAB IAB Ectopic Multiple Live Births          1    # Outcome Date GA Lbr Len/2nd Weight Sex Delivery Anes PTL Lv  2 Current           1 Term 12/09/13 [redacted]w[redacted]d 14:05 / 02:26 7 lb 6.3 oz (3.355 kg) F Vag-Spont EPI N LIV   Physical Assessment:   Vitals:   08/22/20 0907  BP: 113/69  Pulse: 92  Weight: 148 lb (67.1 kg)  Body mass index is 26.22 kg/m.       Physical Examination:  General appearance - well appearing, and in no distress  Mental status - alert, oriented to person, place, and time  Psych:  She has a normal mood and affect  Skin - warm and dry, normal color, no suspicious lesions noted  Chest - effort normal, all lung fields clear to auscultation bilaterally  Heart - normal rate and regular rhythm  Abdomen - soft, nontender  Extremities:  No swelling or varicosities noted  Pelvic - VULVA: normal appearing vulva with no masses, tenderness or lesions  VAGINA: normal appearing vagina with normal color and discharge, no lesions  CERVIX: normal appearing cervix without discharge or lesions, no CMT  Thin prep pap is not done   Chaperone: N/A    TODAY'S NT 10/22/20 12+4 wks,measurements c/w dates,CRL 68.79 mm,NT 1.6 mm,NB present,normal ovaries,fhr 150 bpm,anterior placenta  Results for orders  placed or performed in visit on 08/22/20 (from the past 24 hour(s))  POC Urinalysis Dipstick OB   Collection Time: 08/22/20  9:32 AM  Result Value Ref Range   Color, UA     Clarity, UA     Glucose, UA Negative Negative   Bilirubin, UA     Ketones, UA neg    Spec Grav, UA     Blood, UA neg    pH, UA     POC,PROTEIN,UA Negative Negative, Trace, Small (1+), Moderate (2+), Large (3+), 4+   Urobilinogen, UA     Nitrite, UA neg    Leukocytes, UA Negative Negative   Appearance     Odor      Assessment & Plan:  1) Low-Risk Pregnancy G2P1001 at [redacted]w[redacted]d with an Estimated Date of Delivery: 03/02/21   2) Initial OB visit  3) Dep/anx> anxiety predominant, on xanax,  adderall, cymbalta prior to pregnancy- to start cymbalta back. [facility] referral  Meds: No orders of the defined types were placed in this encounter.   Initial labs obtained Continue prenatal vitamins Reviewed n/v relief measures and warning s/s to report Reviewed recommended weight gain based on pre-gravid BMI Encouraged well-balanced diet Genetic & carrier screening discussed: requests Panorama, NT/IT, and Horizon ,  Ultrasound discussed; fetal survey: requested CCNC completed> form faxed if has or is planning to apply for medicaid The nature of  - Center for [employer] with multiple MDs and other Advanced Practice Providers was explained to patient; also emphasized that fellows, residents, and students are part of our team. Does have home bp cuff. Office bp cuff given: yes. Rx sent: no. Check bp weekly, let us know if consistently >140/90.   Follow-up: No follow-ups on file.   Orders Placed This Encounter  Procedures   Urine Culture   GC/Chlamydia Probe Amp   Integrated 1   Genetic Screening   Pain Management Screening Profile (10S)   CBC/D/Plt+RPR+Rh+ABO+RubIgG...   POC Urinalysis Dipstick OB    Cheral Marker CNM, [facility] 08/22/2020 9:52 AM

## 2020-08-23 LAB — PMP SCREEN PROFILE (10S), URINE
Amphetamine Scrn, Ur: NEGATIVE ng/mL
BARBITURATE SCREEN URINE: NEGATIVE ng/mL
BENZODIAZEPINE SCREEN, URINE: NEGATIVE ng/mL
CANNABINOIDS UR QL SCN: NEGATIVE ng/mL
Cocaine (Metab) Scrn, Ur: NEGATIVE ng/mL
Creatinine(Crt), U: 65.4 mg/dL (ref 20.0–300.0)
Methadone Screen, Urine: NEGATIVE ng/mL
OXYCODONE+OXYMORPHONE UR QL SCN: NEGATIVE ng/mL
Opiate Scrn, Ur: NEGATIVE ng/mL
Ph of Urine: 6.3 (ref 4.5–8.9)
Phencyclidine Qn, Ur: NEGATIVE ng/mL
Propoxyphene Scrn, Ur: NEGATIVE ng/mL

## 2020-08-24 LAB — GC/CHLAMYDIA PROBE AMP
Chlamydia trachomatis, NAA: NEGATIVE
Neisseria Gonorrhoeae by PCR: NEGATIVE

## 2020-08-25 LAB — CBC/D/PLT+RPR+RH+ABO+RUBIGG...
Antibody Screen: NEGATIVE
Basophils Absolute: 0 10*3/uL (ref 0.0–0.2)
Basos: 0 %
EOS (ABSOLUTE): 0.1 10*3/uL (ref 0.0–0.4)
Eos: 1 %
HCV Ab: <0.1 s/co ratio (ref 0.0–0.9)
HIV Screen 4th Generation wRfx: NONREACTIVE
Hematocrit: 36.4 % (ref 34.0–46.6)
Hemoglobin: 12.2 g/dL (ref 11.1–15.9)
Hepatitis B Surface Ag: NEGATIVE
Immature Grans (Abs): 0 10*3/uL (ref 0.0–0.1)
Immature Granulocytes: 0 %
Lymphocytes Absolute: 1.6 10*3/uL (ref 0.7–3.1)
Lymphs: 18 %
MCH: 29.4 pg (ref 26.6–33.0)
MCHC: 33.5 g/dL (ref 31.5–35.7)
MCV: 88 fL (ref 79–97)
Monocytes Absolute: 0.6 10*3/uL (ref 0.1–0.9)
Monocytes: 6 %
Neutrophils Absolute: 6.6 10*3/uL (ref 1.4–7.0)
Neutrophils: 75 %
Platelets: 303 10*3/uL (ref 150–450)
RBC: 4.15 x10E6/uL (ref 3.77–5.28)
RDW: 12.6 % (ref 11.7–15.4)
RPR Ser Ql: NONREACTIVE
Rh Factor: POSITIVE
Rubella Antibodies, IGG: <0.9 index — ABNORMAL LOW (ref >0.99)
WBC: 8.8 10*3/uL (ref 3.4–10.8)

## 2020-08-25 LAB — INTEGRATED 1
Crown Rump Length: 68.8 mm
Gest. Age on Collection Date: 13 weeks
Maternal Age at EDD: 27.9 yr
Nuchal Translucency (NT): 1.6 mm
Number of Fetuses: 1
PAPP-A Value: 1821.7 ng/mL
Weight: 148 [lb_av]

## 2020-08-25 LAB — HCV INTERPRETATION

## 2020-08-27 DIAGNOSIS — R8271 Bacteriuria: Secondary | ICD-10-CM | POA: Insufficient documentation

## 2020-08-27 LAB — URINE CULTURE

## 2020-08-27 MED ORDER — AMOXICILLIN 500 MG PO CAPS: 500.0000 mg | ORAL_CAPSULE | Freq: Two times a day (BID) | ORAL | 0 refills | Status: DC

## 2020-08-27 NOTE — Addendum Note (Signed)
Addended by: Cheral Marker on: 08/27/2020 08:20 AM   Modules accepted: Orders

## 2020-09-02 ENCOUNTER — Encounter: Payer: Self-pay | Admitting: Women's Health

## 2020-09-12 ENCOUNTER — Ambulatory Visit (INDEPENDENT_AMBULATORY_CARE_PROVIDER_SITE_OTHER): Payer: Medicaid Other | Admitting: Advanced Practice Midwife

## 2020-09-12 ENCOUNTER — Encounter: Payer: Self-pay | Admitting: Advanced Practice Midwife

## 2020-09-12 ENCOUNTER — Other Ambulatory Visit: Payer: Self-pay

## 2020-09-12 VITALS — BP 123/79 | HR 79 | Wt 151.0 lb

## 2020-09-12 DIAGNOSIS — Z3480 Encounter for supervision of other normal pregnancy, unspecified trimester: Secondary | ICD-10-CM

## 2020-09-12 DIAGNOSIS — Z363 Encounter for antenatal screening for malformations: Secondary | ICD-10-CM

## 2020-09-12 DIAGNOSIS — R8271 Bacteriuria: Secondary | ICD-10-CM

## 2020-09-12 DIAGNOSIS — Z3A15 15 weeks gestation of pregnancy: Secondary | ICD-10-CM

## 2020-09-12 MED ORDER — PROMETHAZINE HCL 25 MG PO TABS: 25.0000 mg | ORAL_TABLET | Freq: Four times a day (QID) | ORAL | 1 refills | Status: DC | PRN

## 2020-09-12 NOTE — Patient Instructions (Signed)
Rodman Pickle, thank you for choosing our office today! We appreciate the opportunity to meet your healthcare needs. You may receive a short survey by mail, e-mail, or through [employer]. If you are happy with your care we would appreciate if you could take just a few minutes to complete the survey questions. We read all of your comments and take your feedback very seriously. Thank you again for choosing our office.  Center for [employer] Team at [facility] at [facility] ([address] [place], [place] [address]) Entrance C, located off of [address] Free 24/7 valet parking  Go to [email or web address] to register for FREE online childbirth classes  Call the office [phone]) or go to [facility] if: You begin to severe cramping Your water breaks.  Sometimes it is a big gush of fluid, sometimes it is just a trickle that keeps getting your panties wet or running down your legs You have vaginal bleeding.  It is normal to have a small amount of spotting if your cervix was checked.   [facility] Pediatricians/Family Doctors [facility]): [address] Colette Ribas, [phone]           [facility]: [address], [phone]                [facility]): [address], [phone] (call to ask if accepting patients) [facility] Department: [address], [place], [phone]    [facility] Pediatricians/Family Doctors [facility]): [phone] [address], [phone] [facility]: [address] [place], [phone] [facility]: [address], [phone]  [facility] Doctors  [facility]): [phone] [facility]: [address] [phone]   [facility] Doctors [facility]: [address] [phone]  [facility] Doctors  [facility]: [phone], [phone]  Home Blood Pressure Monitoring for Patients   Your provider has recommended that you check your blood pressure (BP) at least once a week at home. If you do not have a blood pressure cuff at home, one will be provided for you. Contact your provider if you have not received your monitor within 1 week.   Helpful Tips for Accurate Home Blood Pressure Checks  Don't smoke, exercise, or drink caffeine 30 minutes before checking your BP Use the restroom before checking your BP (a full bladder can raise your pressure) Relax in a comfortable upright chair Feet on the ground Left arm resting comfortably on a flat surface at the level of your heart Legs uncrossed Back supported Sit quietly and don't talk Place the cuff on your bare arm Adjust snuggly, so that only two fingertips can fit between your skin and the top of the cuff Check 2 readings separated by at least one minute Keep a log of your BP readings For a visual, please reference this diagram: http://ccnc.care/bpdiagram  Provider Name: [facility]     Phone: [phone]  Zone 1: ALL CLEAR  Continue to monitor your symptoms:  BP reading is less than 140 (top number) or less than 90 (bottom number)  No right upper stomach pain No headaches or seeing spots No feeling nauseated or throwing up No swelling in face and hands  Zone 2: CAUTION Call your doctor's office for any of the following:  BP reading is greater than 140 (top number) or greater than  90 (bottom number)  Stomach pain under your ribs in the middle or right side Headaches or seeing spots Feeling nauseated or throwing up Swelling in face and hands  Zone 3: EMERGENCY  Seek immediate medical care if you have any of the following:  BP reading is greater than160 (top number) or greater than 110 (bottom number) Severe headaches not improving with Tylenol Serious difficulty catching your breath Any worsening symptoms from  Zone 2     Second Trimester of Pregnancy The second trimester is from week 14 through week 27 (months 4 through 6). The second trimester is often a time when you feel your best. Your body has adjusted to being pregnant, and you begin to feel better physically. Usually, morning sickness has lessened or quit completely, you may have more energy, and you may have an increase in appetite. The second trimester is also a time when the fetus is growing rapidly. At the end of the sixth month, the fetus is about 9 inches long and weighs about 1 pounds. You will likely begin to feel the baby move (quickening) between 16 and 20 weeks of pregnancy. Body changes during your second trimester Your body continues to go through many changes during your second trimester. The changes vary from woman to woman. Your weight will continue to increase. You will notice your lower abdomen bulging out. You may begin to get stretch marks on your hips, abdomen, and breasts. You may develop headaches that can be relieved by medicines. The medicines should be approved by your health care provider. You may urinate more often because the fetus is pressing on your bladder. You may develop or continue to have heartburn as a result of your pregnancy. You may develop constipation because certain hormones are causing the muscles that push waste through your intestines to slow down. You may develop hemorrhoids or swollen, bulging veins (varicose veins). You may have back pain. This is caused by: Weight gain. Pregnancy hormones that are relaxing the joints in your pelvis. A shift in weight and the muscles that support your balance. Your breasts will continue to grow and they will continue to become tender. Your gums may bleed and may be sensitive to brushing and flossing. Dark spots or blotches (chloasma, mask of pregnancy) may develop on your face. This will likely fade after the baby is born. A dark line from your belly button to  the pubic area (linea nigra) may appear. This will likely fade after the baby is born. You may have changes in your hair. These can include thickening of your hair, rapid growth, and changes in texture. Some women also have hair loss during or after pregnancy, or hair that feels dry or thin. Your hair will most likely return to normal after your baby is born.  What to expect at prenatal visits During a routine prenatal visit: You will be weighed to make sure you and the fetus are growing normally. Your blood pressure will be taken. Your abdomen will be measured to track your baby's growth. The fetal heartbeat will be listened to. Any test results from the previous visit will be discussed.  Your health care provider may ask you: How you are feeling. If you are feeling the baby move. If you have had any abnormal symptoms, such as leaking fluid, bleeding, severe headaches, or abdominal cramping. If you are using any tobacco products, including cigarettes, chewing tobacco, and electronic cigarettes. If you have any questions.  Other tests that may be performed during   your second trimester include: Blood tests that check for: Low iron levels (anemia). High blood sugar that affects pregnant women (gestational diabetes) between 24 and 28 weeks. Rh antibodies. This is to check for a protein on red blood cells (Rh factor). Urine tests to check for infections, diabetes, or protein in the urine. An ultrasound to confirm the proper growth and development of the baby. An amniocentesis to check for possible genetic problems. Fetal screens for spina bifida and Down syndrome. HIV (human immunodeficiency virus) testing. Routine prenatal testing includes screening for HIV, unless you choose not to have this test.  Follow these instructions at home: Medicines Follow your health care provider's instructions regarding medicine use. Specific medicines may be either safe or unsafe to take during  pregnancy. Take a prenatal vitamin that contains at least 600 micrograms (mcg) of folic acid. If you develop constipation, try taking a stool softener if your health care provider approves. Eating and drinking Eat a balanced diet that includes fresh fruits and vegetables, whole grains, good sources of protein such as meat, eggs, or tofu, and low-fat dairy. Your health care provider will help you determine the amount of weight gain that is right for you. Avoid raw meat and uncooked cheese. These carry germs that can cause birth defects in the baby. If you have low calcium intake from food, talk to your health care provider about whether you should take a daily calcium supplement. Limit foods that are high in fat and processed sugars, such as fried and sweet foods. To prevent constipation: Drink enough fluid to keep your urine clear or pale yellow. Eat foods that are high in fiber, such as fresh fruits and vegetables, whole grains, and beans. Activity Exercise only as directed by your health care provider. Most women can continue their usual exercise routine during pregnancy. Try to exercise for 30 minutes at least 5 days a week. Stop exercising if you experience uterine contractions. Avoid heavy lifting, wear low heel shoes, and practice good posture. A sexual relationship may be continued unless your health care provider directs you otherwise. Relieving pain and discomfort Wear a good support bra to prevent discomfort from breast tenderness. Take warm sitz baths to soothe any pain or discomfort caused by hemorrhoids. Use hemorrhoid cream if your health care provider approves. Rest with your legs elevated if you have leg cramps or low back pain. If you develop varicose veins, wear support hose. Elevate your feet for 15 minutes, 3-4 times a day. Limit salt in your diet. Prenatal Care Write down your questions. Take them to your prenatal visits. Keep all your prenatal visits as told by your health  care provider. This is important. Safety Wear your seat belt at all times when driving. Make a list of emergency phone numbers, including numbers for family, friends, the hospital, and police and fire departments. General instructions Ask your health care provider for a referral to a local prenatal education class. Begin classes no later than the beginning of month 6 of your pregnancy. Ask for help if you have counseling or nutritional needs during pregnancy. Your health care provider can offer advice or refer you to specialists for help with various needs. Do not use hot tubs, steam rooms, or saunas. Do not douche or use tampons or scented sanitary pads. Do not cross your legs for long periods of time. Avoid cat litter boxes and soil used by cats. These carry germs that can cause birth defects in the baby and possibly loss of the   fetus by miscarriage or stillbirth. Avoid all smoking, herbs, alcohol, and unprescribed drugs. Chemicals in these products can affect the formation and growth of the baby. Do not use any products that contain nicotine or tobacco, such as cigarettes and e-cigarettes. If you need help quitting, ask your health care provider. Visit your dentist if you have not gone yet during your pregnancy. Use a soft toothbrush to brush your teeth and be gentle when you floss. Contact a health care provider if: You have dizziness. You have mild pelvic cramps, pelvic pressure, or nagging pain in the abdominal area. You have persistent nausea, vomiting, or diarrhea. You have a bad smelling vaginal discharge. You have pain when you urinate. Get help right away if: You have a fever. You are leaking fluid from your vagina. You have spotting or bleeding from your vagina. You have severe abdominal cramping or pain. You have rapid weight gain or weight loss. You have shortness of breath with chest pain. You notice sudden or extreme swelling of your face, hands, ankles, feet, or legs. You  have not felt your baby move in over an hour. You have severe headaches that do not go away when you take medicine. You have vision changes. Summary The second trimester is from week 14 through week 27 (months 4 through 6). It is also a time when the fetus is growing rapidly. Your body goes through many changes during pregnancy. The changes vary from woman to woman. Avoid all smoking, herbs, alcohol, and unprescribed drugs. These chemicals affect the formation and growth your baby. Do not use any tobacco products, such as cigarettes, chewing tobacco, and e-cigarettes. If you need help quitting, ask your health care provider. Contact your health care provider if you have any questions. Keep all prenatal visits as told by your health care provider. This is important. This information is not intended to replace advice given to you by your health care provider. Make sure you discuss any questions you have with your health care provider. Document Released: 02/23/2001 Document Revised: 08/07/2015 Document Reviewed: 05/02/2012 Elsevier Interactive Patient Education  2017 Elsevier Inc.  

## 2020-09-12 NOTE — Progress Notes (Signed)
   LOW-RISK PREGNANCY VISIT Patient name: Sara Foster MRN [id number]  Date of birth: June 12, 1995 Chief Complaint:   Routine Prenatal Visit (Nausea, migraines)  History of Present Illness:   Sara Foster is a 25 y.o. G77P1001 female at [redacted]w[redacted]d with an Estimated Date of Delivery: 03/02/21 being seen today for ongoing management of a low-risk pregnancy.  Today she reports  difficulty sleeping; nausea returning; occ migranes . Contractions: Not present. Vag. Bleeding: None.   . denies leaking of fluid. Review of Systems:   Pertinent items are noted in HPI Denies abnormal vaginal discharge w/ itching/odor/irritation, headaches, visual changes, shortness of breath, chest pain, abdominal pain, severe nausea/vomiting, or problems with urination or bowel movements unless otherwise stated above. Pertinent History Reviewed:  Reviewed past medical,surgical, social, obstetrical and family history.  Reviewed problem list, medications and allergies. Physical Assessment:   Vitals:   09/12/20 1032  BP: 123/79  Pulse: 79  Weight: 151 lb (68.5 kg)  Body mass index is 26.75 kg/m.        Physical Examination:   General appearance: Well appearing, and in no distress  Mental status: Alert, oriented to person, place, and time  Skin: Warm & dry  Cardiovascular: Normal heart rate noted  Respiratory: Normal respiratory effort, no distress  Abdomen: Soft, gravid, nontender  Pelvic: Cervical exam deferred         Extremities: Edema: None  Fetal Status: Fetal Heart Rate (bpm): 153        No results found for this or any previous visit (from the past 24 hour(s)).  Assessment & Plan:  1) Low-risk pregnancy G2P1001 at [redacted]w[redacted]d with an Estimated Date of Delivery: 03/02/21   2) Nausea, rx Phenergan (which also helps w her migranes)  3) Difficulty sleeping, tips given  4) Previous GBS bacteriuria, POC today and ppx in labor   Meds:  Meds ordered this encounter  Medications   promethazine  (PHENERGAN) 25 MG tablet    Sig: Take 1 tablet (25 mg total) by mouth every 6 (six) hours as needed for nausea or vomiting.    Dispense:  30 tablet    Refill:  1    Order Specific Question:   Supervising Provider    Answer:   Lazaro Arms [[id number]]   Labs/procedures today: urine culture, 2nd IT  Plan:  Continue routine obstetrical care   Reviewed: Preterm labor symptoms and general obstetric precautions including but not limited to vaginal bleeding, contractions, leaking of fluid and fetal movement were reviewed in detail with the patient.  All questions were answered. Has home bp cuff.  Check bp weekly, let us know if >140/90.   Follow-up: Return in about 4 weeks (around 10/10/2020) for LROB, [name]: Anatomy, in person.  Orders Placed This Encounter  Procedures   Urine Culture   US OB Comp + 14 Wk   INTEGRATED 2   Sara Foster [facility] 09/12/2020 11:10 AM

## 2020-09-15 LAB — URINE CULTURE

## 2020-09-16 ENCOUNTER — Telehealth: Payer: Self-pay | Admitting: Women's Health

## 2020-09-16 ENCOUNTER — Other Ambulatory Visit: Payer: Self-pay | Admitting: Women's Health

## 2020-09-16 DIAGNOSIS — O285 Abnormal chromosomal and genetic finding on antenatal screening of mother: Secondary | ICD-10-CM

## 2020-09-16 DIAGNOSIS — Z3482 Encounter for supervision of other normal pregnancy, second trimester: Secondary | ICD-10-CM

## 2020-09-16 DIAGNOSIS — Z3480 Encounter for supervision of other normal pregnancy, unspecified trimester: Secondary | ICD-10-CM

## 2020-09-16 DIAGNOSIS — Z363 Encounter for antenatal screening for malformations: Secondary | ICD-10-CM

## 2020-09-16 DIAGNOSIS — R8271 Bacteriuria: Secondary | ICD-10-CM

## 2020-09-16 LAB — INTEGRATED 2
AFP MoM: 2.69
Alpha-Fetoprotein: 89.2 ng/mL
Crown Rump Length: 68.8 mm
DIA MoM: 1.44
DIA Value: 224.5 pg/mL
Estriol, Unconjugated: 0.88 ng/mL
Gest. Age on Collection Date: 13 weeks
Gestational Age: 16 weeks
Maternal Age at EDD: 27.9 yr
Nuchal Translucency (NT): 1.6 mm
Nuchal Translucency MoM: 1.03
Number of Fetuses: 1
PAPP-A MoM: 1.5
PAPP-A Value: 1821.7 ng/mL
Test Results:: POSITIVE — AB
Weight: 148 [lb_av]
Weight: 148 [lb_av]
hCG MoM: 3.08
hCG Value: 114 IU/mL
uE3 MoM: 0.97

## 2020-09-16 MED ORDER — PENICILLIN V POTASSIUM 500 MG PO TABS: 500.0000 mg | ORAL_TABLET | Freq: Four times a day (QID) | ORAL | 0 refills | Status: DC

## 2020-09-16 NOTE — Telephone Encounter (Signed)
Pt called, would also like someone to explain her recent lab test results States it shows baby has spina bifida & she's a little worried

## 2020-09-16 NOTE — Telephone Encounter (Signed)
Returned pt's call. Discussed IT results of increased r/f OSB, just screening, not diagnostic. Offered referral to MFM for genetic counseling and detailed u/s, wants. Orders placed and note routed to [facility] to schedule. Will cancel her anatomy u/s here w/  [name]. Cheral Marker, CNM, [facility] 09/16/2020 4:51 PM

## 2020-09-17 ENCOUNTER — Other Ambulatory Visit: Payer: Self-pay | Admitting: Women's Health

## 2020-09-17 DIAGNOSIS — O285 Abnormal chromosomal and genetic finding on antenatal screening of mother: Secondary | ICD-10-CM

## 2020-09-17 DIAGNOSIS — Z363 Encounter for antenatal screening for malformations: Secondary | ICD-10-CM

## 2020-09-17 DIAGNOSIS — Z3480 Encounter for supervision of other normal pregnancy, unspecified trimester: Secondary | ICD-10-CM

## 2020-09-30 ENCOUNTER — Other Ambulatory Visit: Payer: Self-pay | Admitting: Women's Health

## 2020-09-30 MED ORDER — DULOXETINE HCL 60 MG PO CPEP: 60.0000 mg | ORAL_CAPSULE | Freq: Every day | ORAL | 3 refills | Status: AC

## 2020-10-08 ENCOUNTER — Ambulatory Visit: Payer: Medicaid Other | Admitting: *Deleted

## 2020-10-08 ENCOUNTER — Telehealth: Payer: Self-pay | Admitting: Clinical

## 2020-10-08 ENCOUNTER — Encounter: Payer: Self-pay | Admitting: *Deleted

## 2020-10-08 ENCOUNTER — Ambulatory Visit: Payer: Medicaid Other | Attending: Women's Health

## 2020-10-08 ENCOUNTER — Other Ambulatory Visit: Payer: Self-pay

## 2020-10-08 ENCOUNTER — Ambulatory Visit (HOSPITAL_BASED_OUTPATIENT_CLINIC_OR_DEPARTMENT_OTHER): Payer: Medicaid Other | Admitting: Obstetrics and Gynecology

## 2020-10-08 VITALS — BP 124/72 | HR 109

## 2020-10-08 DIAGNOSIS — Z363 Encounter for antenatal screening for malformations: Secondary | ICD-10-CM

## 2020-10-08 DIAGNOSIS — O281 Abnormal biochemical finding on antenatal screening of mother: Secondary | ICD-10-CM

## 2020-10-08 DIAGNOSIS — O289 Unspecified abnormal findings on antenatal screening of mother: Secondary | ICD-10-CM | POA: Insufficient documentation

## 2020-10-08 DIAGNOSIS — Z3480 Encounter for supervision of other normal pregnancy, unspecified trimester: Secondary | ICD-10-CM | POA: Insufficient documentation

## 2020-10-08 DIAGNOSIS — Z3A19 19 weeks gestation of pregnancy: Secondary | ICD-10-CM | POA: Insufficient documentation

## 2020-10-08 DIAGNOSIS — O285 Abnormal chromosomal and genetic finding on antenatal screening of mother: Secondary | ICD-10-CM

## 2020-10-08 DIAGNOSIS — Z3482 Encounter for supervision of other normal pregnancy, second trimester: Secondary | ICD-10-CM | POA: Insufficient documentation

## 2020-10-08 DIAGNOSIS — R772 Abnormality of alphafetoprotein: Secondary | ICD-10-CM

## 2020-10-08 NOTE — Telephone Encounter (Signed)
Attempt to contact pt to schedule visit with [facility], per referral;Left HIPPA-compliant message to call back Asher Muir from [employer] for [employer] at [facility] at  [phone] [facility] office), and left MyChart message for pt.

## 2020-10-08 NOTE — Progress Notes (Signed)
Maternal-Fetal Medicine   Name: Sara Foster DOB: 1995-07-18 MRN: [id number] Referring Provider: Shawna Clamp, CNM  I had the pleasure of seeing Sara Foster today at the [facility]. She is she is here for fetal anatomy scan. Cell free fetal DNA screening, the risks of fetal aneuploidies are not increased.  On integrated screening, MSAFP was increased at 2.69 MoM and the risk for open spina bifida was 1 and 160 (increased). Patient does not give history of vaginal bleeding.  Obstetric history significant for a term vaginal delivery 2015 for female infant weighing 7 pounds and 6 ounces at birth.  Her pregnancy and delivery were uncomplicated. GYN history: No history of abnormal Pap smears or cervical surgeries.  No history of breast disease. Past medical history: No history of hypertension or diabetes or any chronic medical conditions. Past surgical history: Nil of note. Allergies: No known drug allergies.  Ultrasound We performed fetal anatomical survey.  No markers of aneuploidies or fetal structural defects are seen.  Intracranial structures and spine appear normal.  Abdominal wall appears normal.  Amniotic fluid is normal and good fetal activity seen.  Fetal biometry is consistent with the previously established dates.  I counseled the patient of the following Increased risk for open-neural tube defects and increased AFP I reassured the patient of normal fetal anatomy on ultrasound including spine and intracranial structures. I informed her that ultrasound can detect up to 95 out of 100 cases of spina bifida and that amniocentesis will only marginally improve the detection rate.  Increased AFP can also be associated with fetal growth restriction (placental insufficiency), preterm delivery and stillbirth (very rare). I recommended serial fetal growth assessments. It can also follow vaginal bleeding.  I recommend serial fetal growth assessments every 4 weeks till  delivery. I reassured the patient that expect good pregnancy outcomes in most cases.  Recommendations -Fetal growth assessments every 4 weeks that may be performed at your office.  Thank you for consultation.  If you have any questions or concerns, please contact me the [facility].  Consultation including face-to-face (more than 50%) counseling 30 minutes.

## 2020-10-10 ENCOUNTER — Encounter: Payer: Self-pay | Admitting: Obstetrics and Gynecology

## 2020-10-10 ENCOUNTER — Other Ambulatory Visit: Payer: Medicaid Other

## 2020-10-10 ENCOUNTER — Ambulatory Visit (INDEPENDENT_AMBULATORY_CARE_PROVIDER_SITE_OTHER): Payer: Medicaid Other | Admitting: Obstetrics and Gynecology

## 2020-10-10 ENCOUNTER — Other Ambulatory Visit: Payer: Self-pay

## 2020-10-10 VITALS — BP 127/79 | HR 105 | Wt 160.4 lb

## 2020-10-10 DIAGNOSIS — Z3480 Encounter for supervision of other normal pregnancy, unspecified trimester: Secondary | ICD-10-CM

## 2020-10-10 DIAGNOSIS — O98519 Other viral diseases complicating pregnancy, unspecified trimester: Secondary | ICD-10-CM

## 2020-10-10 DIAGNOSIS — R8271 Bacteriuria: Secondary | ICD-10-CM

## 2020-10-10 DIAGNOSIS — O09899 Supervision of other high risk pregnancies, unspecified trimester: Secondary | ICD-10-CM

## 2020-10-10 DIAGNOSIS — Z1389 Encounter for screening for other disorder: Secondary | ICD-10-CM

## 2020-10-10 DIAGNOSIS — Z331 Pregnant state, incidental: Secondary | ICD-10-CM

## 2020-10-10 DIAGNOSIS — Z2839 Other underimmunization status: Secondary | ICD-10-CM

## 2020-10-10 DIAGNOSIS — O285 Abnormal chromosomal and genetic finding on antenatal screening of mother: Secondary | ICD-10-CM

## 2020-10-10 DIAGNOSIS — B009 Herpesviral infection, unspecified: Secondary | ICD-10-CM

## 2020-10-10 LAB — POCT URINALYSIS DIPSTICK OB
Blood, UA: NEGATIVE
Glucose, UA: NEGATIVE
Ketones, UA: NEGATIVE
Nitrite, UA: NEGATIVE

## 2020-10-10 NOTE — Patient Instructions (Signed)

## 2020-10-10 NOTE — Progress Notes (Signed)
Subjective:  Sara Foster is a 25 y.o. G2P1001 at [redacted]w[redacted]d being seen today for ongoing prenatal care.  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  Patient reports no complaints.  Contractions: Not present.  .  Movement: Present. Denies leaking of fluid.   The following portions of the patient's history were reviewed and updated as appropriate: allergies, current medications, past family history, past medical history, past social history, past surgical history and problem list. Problem list updated.  Objective:   Vitals:   10/10/20 1138  BP: 127/79  Pulse: (!) 105  Weight: 160 lb 6.4 oz (72.8 kg)    Fetal Status:     Movement: Present     General:  Alert, oriented and cooperative. Patient is in no acute distress.  Skin: Skin is warm and dry. No rash noted.   Cardiovascular: Normal heart rate noted  Respiratory: Normal respiratory effort, no problems with respiration noted  Abdomen: Soft, gravid, appropriate for gestational age. Pain/Pressure: Present     Pelvic:  Cervical exam deferred        Extremities: Normal range of motion.  Edema: None  Mental Status: Normal mood and affect. Normal behavior. Normal judgment and thought content.   Urinalysis:      Assessment and Plan:  Pregnancy: G2P1001 at [redacted]w[redacted]d  1. Supervision of other normal pregnancy, antepartum Stable   2. GBS bacteriuria Tx while in labor  3. Abnormal chromosomal and genetic finding on antenatal screening mother Seen by MFM Normal U/S Serial growth scans as per MFM recommendation Ordered with next OB visit  4. Rubella non-immune status,  antepartum Vaccine PP  5. Herpes simplex type 2 (HSV-2) infection affecting pregnancy, antepartum Suppression at 36 weeks  6. Pregnant state, incidental  - POC Urinalysis Dipstick OB  7. Screening for genitourinary condition  - POC Urinalysis Dipstick OB  Preterm labor symptoms and general obstetric precautions including but not limited to vaginal bleeding, contractions, leaking of fluid and fetal movement were reviewed in detail with the patient. Please refer to After Visit Summary for other counseling recommendations.  Return in about 4 weeks (around 11/07/2020) for OB visit, face to face, any provider.   Hermina Staggers, MD

## 2020-11-04 ENCOUNTER — Other Ambulatory Visit: Payer: Self-pay | Admitting: Advanced Practice Midwife

## 2020-11-06 ENCOUNTER — Other Ambulatory Visit: Payer: Self-pay | Admitting: Obstetrics and Gynecology

## 2020-11-06 DIAGNOSIS — O285 Abnormal chromosomal and genetic finding on antenatal screening of mother: Secondary | ICD-10-CM

## 2020-11-07 ENCOUNTER — Encounter: Payer: Self-pay | Admitting: Medical

## 2020-11-07 ENCOUNTER — Ambulatory Visit (INDEPENDENT_AMBULATORY_CARE_PROVIDER_SITE_OTHER): Payer: Medicaid Other

## 2020-11-07 ENCOUNTER — Ambulatory Visit (INDEPENDENT_AMBULATORY_CARE_PROVIDER_SITE_OTHER): Payer: Medicaid Other | Admitting: Medical

## 2020-11-07 ENCOUNTER — Other Ambulatory Visit: Payer: Self-pay

## 2020-11-07 VITALS — BP 137/88 | HR 112 | Wt 173.0 lb

## 2020-11-07 DIAGNOSIS — O285 Abnormal chromosomal and genetic finding on antenatal screening of mother: Secondary | ICD-10-CM

## 2020-11-07 DIAGNOSIS — F418 Other specified anxiety disorders: Secondary | ICD-10-CM

## 2020-11-07 DIAGNOSIS — O09899 Supervision of other high risk pregnancies, unspecified trimester: Secondary | ICD-10-CM

## 2020-11-07 DIAGNOSIS — R8271 Bacteriuria: Secondary | ICD-10-CM

## 2020-11-07 DIAGNOSIS — Z3480 Encounter for supervision of other normal pregnancy, unspecified trimester: Secondary | ICD-10-CM

## 2020-11-07 DIAGNOSIS — Z3A23 23 weeks gestation of pregnancy: Secondary | ICD-10-CM

## 2020-11-07 DIAGNOSIS — O162 Unspecified maternal hypertension, second trimester: Secondary | ICD-10-CM

## 2020-11-07 DIAGNOSIS — O98519 Other viral diseases complicating pregnancy, unspecified trimester: Secondary | ICD-10-CM

## 2020-11-07 DIAGNOSIS — Z2839 Other underimmunization status: Secondary | ICD-10-CM

## 2020-11-07 DIAGNOSIS — B009 Herpesviral infection, unspecified: Secondary | ICD-10-CM

## 2020-11-07 MED ORDER — BLOOD PRESSURE KIT DEVI: 1.0000 [IU] | 0 refills | Status: DC

## 2020-11-07 NOTE — Progress Notes (Signed)
   PRENATAL VISIT NOTE  Subjective:  Sara Foster is a 25 y.o. G2P1001 at [redacted]w[redacted]d being seen today for ongoing prenatal care.  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  Patient reports backache and occasional contractions.  Contractions: Not present. Vag. Bleeding: None.  Movement: Present. Denies leaking of fluid.   The following portions of the patient's history were reviewed and updated as appropriate: allergies, current medications, past family history, past medical history, past social history, past surgical history and problem list.   Objective:   Vitals:   11/07/20 1034 11/07/20 1037  BP: (!) 142/83 137/88  Pulse: (!) 112   Weight: 173 lb (78.5 kg)     Fetal Status: Fetal Heart Rate (bpm): 140 Fundal Height: 25 cm Movement: Present     General:  Alert, oriented and cooperative. Patient is in no acute distress.  Skin: Skin is warm and dry. No rash noted.   Cardiovascular: Normal heart rate noted  Respiratory: Normal respiratory effort, no problems with respiration noted  Abdomen: Soft, gravid, appropriate for gestational age.  Pain/Pressure: Absent     Pelvic: Cervical exam deferred        Extremities: Normal range of motion.  Edema: None  Mental Status: Normal mood and affect. Normal behavior. Normal judgment and thought content.   Assessment and Plan:  Pregnancy: G2P1001 at [redacted]w[redacted]d 1. Supervision of other normal pregnancy, antepartum  2. GBS bacteriuria - Urine Culture today   3. Abnormal chromosomal and genetic finding on antenatal screening mother - Saw MFM, normal [name], recommend to repeat growth q  4 weeks at [facility]  4. Depression with anxiety  5. Herpes simplex type 2 (HSV-2) infection affecting pregnancy, antepartum - PPx at 35 weeks   6. Rubella non-immune status, antepartum - MMR PP   7. [redacted] weeks gestation of pregnancy  8. Elevated blood pressure affecting pregnancy in second trimester, antepartum - Denies history of HTN or previously pregnancy complications  - Comp Met (CMET) - CBC - Protein / creatinine ratio, urine - Blood Pressure Monitoring (BLOOD PRESSURE KIT) DEVI; 1 Units by Does not apply route once a week.  Dispense: 1 each; Refill: 0  9. Round ligament pain  - advised abdominal binder - hydrotherapy and tylenol   10. Sciatica - Advised abdominal binder, moderate activity  - Can consider chiropractor or PT if persists or worsens   Preterm labor symptoms and general obstetric precautions including but not limited to vaginal bleeding, contractions, leaking of fluid and fetal movement were reviewed in detail with the patient. Please refer to After Visit Summary for other counseling recommendations.   Return in about 4 weeks (around 12/05/2020) for LOB, In-Person.  Future Appointments  Date Time Provider [place]  12/05/2020  8:30 AM [facility] [name] [facility] None  12/05/2020  9:10 AM Myrtis Ser, CNM [facility]    Kerry Hough, PA-C

## 2020-11-07 NOTE — Progress Notes (Signed)
[name] 23+4 wks,breech,anterior placenta gr 0,normal ovaries,cx 3.2 cm,SVP of fluid 6.7 cm,fhr 142 bpm,LVEICF 1.8 mm,EFW 690 g 78%

## 2020-11-08 ENCOUNTER — Encounter (HOSPITAL_COMMUNITY): Payer: Self-pay | Admitting: Obstetrics and Gynecology

## 2020-11-08 ENCOUNTER — Other Ambulatory Visit: Payer: Self-pay

## 2020-11-08 ENCOUNTER — Inpatient Hospital Stay (HOSPITAL_COMMUNITY)
Admission: AD | Admit: 2020-11-08 | Discharge: 2020-11-08 | Disposition: A | Discharge status: Home / Self Care | Payer: Medicaid Other | Attending: Obstetrics and Gynecology | Admitting: Obstetrics and Gynecology

## 2020-11-08 DIAGNOSIS — R030 Elevated blood-pressure reading, without diagnosis of hypertension: Secondary | ICD-10-CM | POA: Insufficient documentation

## 2020-11-08 DIAGNOSIS — Z3A23 23 weeks gestation of pregnancy: Secondary | ICD-10-CM | POA: Diagnosis not present

## 2020-11-08 DIAGNOSIS — Z79899 Other long term (current) drug therapy: Secondary | ICD-10-CM | POA: Diagnosis not present

## 2020-11-08 DIAGNOSIS — R519 Headache, unspecified: Secondary | ICD-10-CM | POA: Diagnosis not present

## 2020-11-08 DIAGNOSIS — R109 Unspecified abdominal pain: Secondary | ICD-10-CM | POA: Diagnosis not present

## 2020-11-08 DIAGNOSIS — O26892 Other specified pregnancy related conditions, second trimester: Secondary | ICD-10-CM | POA: Diagnosis not present

## 2020-11-08 LAB — COMPREHENSIVE METABOLIC PANEL
ALT: 18 U/L (ref 0–44)
AST: 22 U/L (ref 15–41)
Albumin: 2.9 g/dL — ABNORMAL LOW (ref 3.5–5.0)
Alkaline Phosphatase: 65 U/L (ref 38–126)
Anion gap: 9 (ref 5–15)
BUN: <5 mg/dL — ABNORMAL LOW (ref 6–20)
CO2: 19 mmol/L — ABNORMAL LOW (ref 22–32)
Calcium: 8.5 mg/dL — ABNORMAL LOW (ref 8.9–10.3)
Chloride: 109 mmol/L (ref 98–111)
Creatinine, Ser: 0.41 mg/dL — ABNORMAL LOW (ref 0.44–1.00)
GFR, Estimated: >60 mL/min (ref >60)
Glucose, Bld: 78 mg/dL (ref 70–99)
Potassium: 3.6 mmol/L (ref 3.5–5.1)
Sodium: 137 mmol/L (ref 135–145)
Total Bilirubin: 0.5 mg/dL (ref 0.3–1.2)
Total Protein: 5.7 g/dL — ABNORMAL LOW (ref 6.5–8.1)

## 2020-11-08 LAB — PROTEIN / CREATININE RATIO, URINE
Creatinine, Urine: 101.65 mg/dL
Protein Creatinine Ratio: 0.16 mg/mg{Cre} — ABNORMAL HIGH (ref 0.00–0.15)
Total Protein, Urine: 16 mg/dL

## 2020-11-08 LAB — CBC
HCT: 33.7 % — ABNORMAL LOW (ref 36.0–46.0)
Hemoglobin: 11.9 g/dL — ABNORMAL LOW (ref 12.0–15.0)
MCH: 31.2 pg (ref 26.0–34.0)
MCHC: 35.3 g/dL (ref 30.0–36.0)
MCV: 88.2 fL (ref 80.0–100.0)
Platelets: 284 10*3/uL (ref 150–400)
RBC: 3.82 MIL/uL — ABNORMAL LOW (ref 3.87–5.11)
RDW: 13.2 % (ref 11.5–15.5)
WBC: 13.8 10*3/uL — ABNORMAL HIGH (ref 4.0–10.5)
nRBC: 0 % (ref 0.0–0.2)

## 2020-11-08 LAB — URINALYSIS, ROUTINE W REFLEX MICROSCOPIC
Bilirubin Urine: NEGATIVE
Glucose, UA: NEGATIVE mg/dL
Ketones, ur: 80 mg/dL — AB
Nitrite: NEGATIVE
Protein, ur: NEGATIVE mg/dL
Specific Gravity, Urine: 1.015 (ref 1.005–1.030)
pH: 5 (ref 5.0–8.0)

## 2020-11-08 MED ORDER — ACETAMINOPHEN 500 MG PO TABS
1000.0000 mg | ORAL_TABLET | Freq: Once | ORAL | Status: AC
  Administered 2020-11-08: 1000 mg via ORAL
  Filled 2020-11-08: qty 2

## 2020-11-08 MED ORDER — METOCLOPRAMIDE HCL 10 MG PO TABS
10.0000 mg | ORAL_TABLET | Freq: Once | ORAL | Status: AC
  Administered 2020-11-08: 10 mg via ORAL
  Filled 2020-11-08: qty 1

## 2020-11-08 MED ORDER — METOCLOPRAMIDE HCL 10 MG PO TABS: 10.0000 mg | ORAL_TABLET | Freq: Four times a day (QID) | ORAL | 0 refills | Status: DC

## 2020-11-08 MED ORDER — CYCLOBENZAPRINE HCL 5 MG PO TABS
10.0000 mg | ORAL_TABLET | Freq: Once | ORAL | Status: AC
  Administered 2020-11-08: 10 mg via ORAL
  Filled 2020-11-08: qty 2

## 2020-11-08 MED ORDER — CYCLOBENZAPRINE HCL 5 MG PO TABS: 5.0000 mg | ORAL_TABLET | Freq: Three times a day (TID) | ORAL | 0 refills | Status: DC | PRN

## 2020-11-08 NOTE — MAU Note (Signed)
Just been having high BP.  They drew some lab work yesterday.  BP has been up and has a HA.  Took some tylenol and promethazine last night, nausea went away but the HA continues. Pain in upper abd.

## 2020-11-08 NOTE — Discharge Instructions (Signed)

## 2020-11-08 NOTE — MAU Provider Note (Signed)
History     CSN: [id number]  Arrival date and time: 11/08/20 1359   Event Date/Time   First Provider Initiated Contact with Patient 11/08/20 1520      Chief Complaint  Patient presents with   Abdominal Pain   Headache   Hypertension   HPI Sara Foster is a 25 y.o. G2P1001 at 55w5dwho presents with a headache and concerns over her blood pressure. She was seen in the office yesterday and had a new onset of elevated blood pressure with labs drawn. Labs were within normal limits. She reports she checked her BP last night and it was 130s/90s. Today all her BPs have been 130s/80s. She reports a persistent headache since she was in the office yesterday. She rates the pain a 8/10 and has tried tylenol last night with no relief. She also reports intermittent abdominal pain. She denies any bleeding or leaking. Reports normal fetal movement.   OB History     Gravida  2   Para  1   Term  1   Preterm      AB      Living  1      SAB      IAB      Ectopic      Multiple      Live Births  1           Past Medical History:  Diagnosis Date   Chlamydia 2015   Encounter for education about contraceptive use 03/24/2015   Generalized anxiety disorder    Gonorrhea 2015   Low back pain    Migraines    OAB (overactive bladder) 05/06/2014    Past Surgical History:  Procedure Laterality Date   WISDOM TOOTH EXTRACTION      Family History  Problem Relation Age of Onset   Asthma Mother    COPD Father    Lung disease Father    Asthma Daughter    Breast cancer Maternal Aunt    Arthritis Maternal Grandmother    COPD Maternal Grandfather    Lung disease Maternal Grandfather    Heart disease Paternal Grandfather    Cancer Other        maternal great grandma   Migraines Other     Social History   Tobacco Use   Smoking status: Never   Smokeless tobacco: Never  Vaping Use   Vaping Use: Never used  Substance Use Topics   Alcohol use: Not Currently    Comment:  very rarely   Drug use: No    Allergies: No Known Allergies  Medications Prior to Admission  Medication Sig Dispense Refill Last Dose   DULoxetine (CYMBALTA) 60 MG capsule Take 1 capsule (60 mg total) by mouth daily. 90 capsule 3 11/07/2020   montelukast (SINGULAIR) 10 MG tablet Take 10 mg by mouth daily.   11/07/2020   Prenatal Vit-Fe Fumarate-FA (MULTIVITAMIN-PRENATAL) 27-0.8 MG TABS tablet Take 1 tablet by mouth daily at 12 noon.   11/07/2020   promethazine (PHENERGAN) 25 MG tablet TAKE 1 TABLET BY MOUTH EVERY 6 HOURS AS NEEDED FOR NAUSEA AND VOMITING 30 tablet 1 11/07/2020   Blood Pressure Monitoring (BLOOD PRESSURE KIT) DEVI 1 Units by Does not apply route once a week. 1 each 0    doxylamine, Sleep, (UNISOM) 25 MG tablet Take 25 mg by mouth at bedtime as needed. (Patient not taking: No sig reported)       Review of Systems  Constitutional: Negative.  Negative for fatigue  and fever.  HENT: Negative.    Respiratory: Negative.  Negative for shortness of breath.   Cardiovascular: Negative.  Negative for chest pain.  Gastrointestinal:  Positive for abdominal pain. Negative for constipation, diarrhea, nausea and vomiting.  Genitourinary: Negative.  Negative for dysuria, vaginal bleeding and vaginal discharge.  Neurological:  Positive for headaches. Negative for dizziness.  Physical Exam   Blood pressure 139/80, pulse (!) 124, temperature 98.2 F (36.8 C), temperature source Oral, resp. rate 18, height '5\' 3"'  (1.6 m), weight 76.8 kg, last menstrual period 05/26/2020, SpO2 99 %.  Physical Exam Vitals and nursing note reviewed.  Constitutional:      General: She is not in acute distress.    Appearance: She is well-developed.  HENT:     Head: Normocephalic.  Eyes:     Pupils: Pupils are equal, round, and reactive to light.  Cardiovascular:     Rate and Rhythm: Normal rate and regular rhythm.     Heart sounds: Normal heart sounds.  Pulmonary:     Effort: Pulmonary effort is normal. No  respiratory distress.     Breath sounds: Normal breath sounds.  Abdominal:     General: Bowel sounds are normal. There is no distension.     Palpations: Abdomen is soft.     Tenderness: There is no abdominal tenderness.  Skin:    General: Skin is warm and dry.  Neurological:     Mental Status: She is alert and oriented to person, place, and time.  Psychiatric:        Mood and Affect: Mood normal.        Behavior: Behavior normal.        Thought Content: Thought content normal.        Judgment: Judgment normal.   Fetal Tracing:  Baseline: 140 Variability: moderate Accels: 15x15 Decels: none  Toco: none   Cervix: closed/thick/anterior   MAU Course  Procedures Results for orders placed or performed during the hospital encounter of 11/08/20 (from the past 24 hour(s))  CBC     Status: Abnormal   Collection Time: 11/08/20  3:15 PM  Result Value Ref Range   WBC 13.8 (H) 4.0 - 10.5 K/uL   RBC 3.82 (L) 3.87 - 5.11 MIL/uL   Hemoglobin 11.9 (L) 12.0 - 15.0 g/dL   HCT 33.7 (L) 36.0 - 46.0 %   MCV 88.2 80.0 - 100.0 fL   MCH 31.2 26.0 - 34.0 pg   MCHC 35.3 30.0 - 36.0 g/dL   RDW 13.2 11.5 - 15.5 %   Platelets 284 150 - 400 K/uL   nRBC 0.0 0.0 - 0.2 %  Comprehensive metabolic panel     Status: Abnormal   Collection Time: 11/08/20  3:15 PM  Result Value Ref Range   Sodium 137 135 - 145 mmol/L   Potassium 3.6 3.5 - 5.1 mmol/L   Chloride 109 98 - 111 mmol/L   CO2 19 (L) 22 - 32 mmol/L   Glucose, Bld 78 70 - 99 mg/dL   BUN <5 (L) 6 - 20 mg/dL   Creatinine, Ser 0.41 (L) 0.44 - 1.00 mg/dL   Calcium 8.5 (L) 8.9 - 10.3 mg/dL   Total Protein 5.7 (L) 6.5 - 8.1 g/dL   Albumin 2.9 (L) 3.5 - 5.0 g/dL   AST 22 15 - 41 U/L   ALT 18 0 - 44 U/L   Alkaline Phosphatase 65 38 - 126 U/L   Total Bilirubin 0.5 0.3 - 1.2 mg/dL  GFR, Estimated >60 >60 mL/min   Anion gap 9 5 - 15  Urinalysis, Routine w reflex microscopic Urine, Clean Catch     Status: Abnormal   Collection Time: 11/08/20   3:21 PM  Result Value Ref Range   Color, Urine YELLOW YELLOW   APPearance CLOUDY (A) CLEAR   Specific Gravity, Urine 1.015 1.005 - 1.030   pH 5.0 5.0 - 8.0   Glucose, UA NEGATIVE NEGATIVE mg/dL   Hgb urine dipstick SMALL (A) NEGATIVE   Bilirubin Urine NEGATIVE NEGATIVE   Ketones, ur 80 (A) NEGATIVE mg/dL   Protein, ur NEGATIVE NEGATIVE mg/dL   Nitrite NEGATIVE NEGATIVE   Leukocytes,Ua LARGE (A) NEGATIVE   RBC / HPF 0-5 0 - 5 RBC/hpf   WBC, UA 21-50 0 - 5 WBC/hpf   Bacteria, UA RARE (A) NONE SEEN   Squamous Epithelial / LPF 21-50 0 - 5   Mucus PRESENT   Protein / creatinine ratio, urine     Status: Abnormal   Collection Time: 11/08/20  3:21 PM  Result Value Ref Range   Creatinine, Urine 101.65 mg/dL   Total Protein, Urine 16 mg/dL   Protein Creatinine Ratio 0.16 (H) 0.00 - 0.15 mg/mg[Cre]    MDM UA, UC CBC, CMP, Protein/creat ratio Tylenol and Reglan PO- patient reports HA is now a 5/10 Flexeril PO- patient reports resolution of headache.   Lengthy discussion of preeclampsia and warning signs. Encouraged patient to check BP daily and reviewed when to come back to MAU.  Assessment and Plan   1. Pregnancy headache in second trimester   2. [redacted] weeks gestation of pregnancy    -Discharge home in stable condition -Rx for reglan and flexeril sent to patient's pharmacy -Preeclampsia precautions discussed -Patient advised to follow-up with [facility] as scheduled for prenatal care -Patient may return to MAU as needed or if her condition were to change or worsen   Wende Mott CNM 11/08/2020, 3:20 PM

## 2020-11-09 LAB — COMPREHENSIVE METABOLIC PANEL
ALT: 18 IU/L (ref 0–32)
AST: 22 IU/L (ref 0–40)
Albumin/Globulin Ratio: 1.4 (ref 1.2–2.2)
Albumin: 3.6 g/dL — ABNORMAL LOW (ref 3.9–5.0)
Alkaline Phosphatase: 79 IU/L (ref 44–121)
BUN/Creatinine Ratio: 10 (ref 9–23)
BUN: 4 mg/dL — ABNORMAL LOW (ref 6–20)
Bilirubin Total: <0.2 mg/dL (ref 0.0–1.2)
CO2: 19 mmol/L — ABNORMAL LOW (ref 20–29)
Calcium: 8.9 mg/dL (ref 8.7–10.2)
Chloride: 106 mmol/L (ref 96–106)
Creatinine, Ser: 0.39 mg/dL — ABNORMAL LOW (ref 0.57–1.00)
Globulin, Total: 2.5 g/dL (ref 1.5–4.5)
Glucose: 99 mg/dL (ref 65–99)
Potassium: 3.9 mmol/L (ref 3.5–5.2)
Sodium: 139 mmol/L (ref 134–144)
Total Protein: 6.1 g/dL (ref 6.0–8.5)
eGFR: 140 mL/min/{1.73_m2} (ref >59)

## 2020-11-09 LAB — CULTURE, OB URINE: Culture: 6000 — AB

## 2020-11-09 LAB — CBC
Hematocrit: 36.1 % (ref 34.0–46.6)
Hemoglobin: 12.4 g/dL (ref 11.1–15.9)
MCH: 30.5 pg (ref 26.6–33.0)
MCHC: 34.3 g/dL (ref 31.5–35.7)
MCV: 89 fL (ref 79–97)
Platelets: 279 10*3/uL (ref 150–450)
RBC: 4.06 x10E6/uL (ref 3.77–5.28)
RDW: 13.1 % (ref 11.7–15.4)
WBC: 13.1 10*3/uL — ABNORMAL HIGH (ref 3.4–10.8)

## 2020-11-09 LAB — URINE CULTURE

## 2020-11-09 LAB — PROTEIN / CREATININE RATIO, URINE
Creatinine, Urine: 41 mg/dL
Protein, Ur: 6.9 mg/dL
Protein/Creat Ratio: 168 mg/g creat (ref 0–200)

## 2020-11-11 ENCOUNTER — Encounter: Payer: Self-pay | Admitting: Medical

## 2020-11-11 DIAGNOSIS — O162 Unspecified maternal hypertension, second trimester: Secondary | ICD-10-CM | POA: Insufficient documentation

## 2020-11-11 MED ORDER — AMOXICILLIN 500 MG PO CAPS: 500.0000 mg | ORAL_CAPSULE | Freq: Three times a day (TID) | ORAL | 0 refills | Status: DC

## 2020-11-11 NOTE — Addendum Note (Signed)
Addended by: Marny Lowenstein on: 11/11/2020 10:28 AM   Modules accepted: Orders

## 2020-11-13 ENCOUNTER — Other Ambulatory Visit: Payer: Self-pay

## 2020-11-13 ENCOUNTER — Ambulatory Visit (INDEPENDENT_AMBULATORY_CARE_PROVIDER_SITE_OTHER): Payer: Medicaid Other

## 2020-11-13 VITALS — BP 126/81 | HR 104 | Wt 170.0 lb

## 2020-11-13 DIAGNOSIS — Z0130 Encounter for examination of blood pressure without abnormal findings: Secondary | ICD-10-CM

## 2020-11-13 NOTE — Progress Notes (Addendum)
   NURSE VISIT- BLOOD PRESSURE CHECK  SUBJECTIVE:  Sara Foster is a 25 y.o. G52P1001 female here for BP check. She is [redacted]w[redacted]d pregnant    HYPERTENSION ROS:  Pregnant:  Severe headaches that don't go away with tylenol/other medicines: No  Visual changes (seeing spots/double/blurred vision) Yes  Severe pain under right breast breast or in center of upper chest No  Severe nausea/vomiting No  Taking medicines as instructed yes  OBJECTIVE:  BP 126/81   Pulse (!) 104   Wt 170 lb (77.1 kg)   LMP 05/26/2020 (Exact Date)   BMI 30.11 kg/m   Appearance alert, well appearing, and in no distress and oriented to person, place, and time.  ASSESSMENT: Pregnancy [redacted]w[redacted]d  blood pressure check  PLAN: Discussed with Joellyn Haff, CNM, [facility]   Recommendations: no changes needed   Follow-up: as scheduled, bring cuff from home  Latorsha Curling A Ryleigh Buenger  11/13/2020 11:03 AM   Chart reviewed for nurse visit. Agree with plan of care.  Cheral Marker, [place] 11/13/2020 11:34 AM

## 2020-12-04 ENCOUNTER — Other Ambulatory Visit: Payer: Self-pay | Admitting: Advanced Practice Midwife

## 2020-12-04 DIAGNOSIS — O280 Abnormal hematological finding on antenatal screening of mother: Secondary | ICD-10-CM

## 2020-12-05 ENCOUNTER — Ambulatory Visit (INDEPENDENT_AMBULATORY_CARE_PROVIDER_SITE_OTHER): Payer: Medicaid Other | Admitting: Advanced Practice Midwife

## 2020-12-05 ENCOUNTER — Encounter: Payer: Self-pay | Admitting: Advanced Practice Midwife

## 2020-12-05 ENCOUNTER — Other Ambulatory Visit: Payer: Self-pay

## 2020-12-05 ENCOUNTER — Ambulatory Visit (INDEPENDENT_AMBULATORY_CARE_PROVIDER_SITE_OTHER): Payer: Medicaid Other

## 2020-12-05 VITALS — BP 130/72 | HR 92 | Wt 176.0 lb

## 2020-12-05 DIAGNOSIS — Z23 Encounter for immunization: Secondary | ICD-10-CM | POA: Diagnosis not present

## 2020-12-05 DIAGNOSIS — Z3A27 27 weeks gestation of pregnancy: Secondary | ICD-10-CM | POA: Diagnosis not present

## 2020-12-05 DIAGNOSIS — Z3480 Encounter for supervision of other normal pregnancy, unspecified trimester: Secondary | ICD-10-CM | POA: Diagnosis not present

## 2020-12-05 DIAGNOSIS — O162 Unspecified maternal hypertension, second trimester: Secondary | ICD-10-CM

## 2020-12-05 DIAGNOSIS — O280 Abnormal hematological finding on antenatal screening of mother: Secondary | ICD-10-CM | POA: Diagnosis not present

## 2020-12-05 DIAGNOSIS — R8271 Bacteriuria: Secondary | ICD-10-CM

## 2020-12-05 NOTE — Progress Notes (Signed)
   LOW-RISK PREGNANCY VISIT Patient name: Sara Foster MRN [id number]  Date of birth: 05/06/1995 Chief Complaint:   Routine Prenatal Visit  History of Present Illness:   Sara Foster is a 25 y.o. G32P1001 female at [redacted]w[redacted]d with an Estimated Date of Delivery: 03/02/21 being seen today for ongoing management of a low-risk pregnancy.  Today she reports  having reg headaches and difficulty sleeping ; thought she was supposed to do the PN2 today but it was missed. Contractions: Irritability. Vag. Bleeding: None.  Movement: Present. denies leaking of fluid. Review of Systems:   Pertinent items are noted in HPI Denies abnormal vaginal discharge w/ itching/odor/irritation, headaches, visual changes, shortness of breath, chest pain, abdominal pain, severe nausea/vomiting, or problems with urination or bowel movements unless otherwise stated above. Pertinent History Reviewed:  Reviewed past medical,surgical, social, obstetrical and family history.  Reviewed problem list, medications and allergies. Physical Assessment:   Vitals:   12/05/20 0908  BP: 130/72  Pulse: 92  Weight: 176 lb (79.8 kg)  Body mass index is 31.18 kg/m.        Physical Examination:   General appearance: Well appearing, and in no distress  Mental status: Alert, oriented to person, place, and time  Skin: Warm & dry  Cardiovascular: Normal heart rate noted  Respiratory: Normal respiratory effort, no distress  Abdomen: Soft, gravid, nontender  Pelvic: Cervical exam deferred         Extremities: Edema: None  Fetal Status: Fetal Heart Rate (bpm): 171 u/s   Movement: Present    Growth u/s: [name] 27+4 wks,breech,cx 3.1 cm,anterior placenta gr 0,normal ovaries,AFI 18 cm,FHR 171 BPM,LVEICF 2.2 mm,EFW 1308 G 86%  No results found for this or any previous visit (from the past 24 hour(s)).  Assessment & Plan:  1) Low-risk pregnancy G2P1001 at [redacted]w[redacted]d with an Estimated Date of Delivery: 03/02/21   2) Anxiety, doing well on  Cymbalta  3) Headaches, not sleeping well; thinks it may be from that; reviewed tips for sleeping/settling down in the evening including Unisom or Benadryl prn  4) ^ risk for OSB, nl MFM scan, EFW 86% today; will continue growth q 4wks  5) Previous elevated BP at 23.4wk, has been normal since; will continue to watch; her BP cuff correlates well with the office one  6) Increased weight gain (46lbs), tips given   Meds: No orders of the defined types were placed in this encounter.  Labs/procedures today: Tdap; growth u/s  Plan:  Continue routine obstetrical care   Reviewed: Preterm labor symptoms and general obstetric precautions including but not limited to vaginal bleeding, contractions, leaking of fluid and fetal movement were reviewed in detail with the patient.  All questions were answered. Has home bp cuff. . Check bp weekly, let us know if >140/90.   Follow-up: Return for PN2 ASAP; 2-3wk LROB in person.  Orders Placed This Encounter  Procedures   Tdap vaccine greater than or equal to 7yo IM   [name] [facility] 12/05/2020 9:25 AM

## 2020-12-05 NOTE — Progress Notes (Signed)
[name] 27+4 wks,breech,cx 3.1 cm,anterior placenta gr 0,normal ovaries,AFI 18 cm,FHR 171 BPM,LVEICF 2.2 mm,EFW 1308 G 86%

## 2020-12-05 NOTE — Patient Instructions (Signed)
Sara Foster, I greatly value your feedback.  If you receive a survey following your visit with [name] today, we appreciate you taking the time to fill it out.  Thanks, Philipp Deputy, CNM   You will have your sugar test next visit.  Please do not eat or drink anything after midnight the night before you come, not even water.  You will be here for at least two hours.  Please make an appointment online for the bloodwork at [email or web address] for 8:30am (or as close to this as possible). Make sure you select the [facility] service center. The day of the appointment, check in with our office first, then you will go to [facility] to start the sugar test.    [facility] HAS MOVED!!! It is now [facility] at [facility] ([address] [place], [place] [address]) Entrance C, located off of [address] valet parking  Go to [email or web address] to register for FREE online childbirth classes   Call the office [phone]) or go to [facility] if: You begin to have strong, frequent contractions Your water breaks.  Sometimes it is a big gush of fluid, sometimes it is just a trickle that keeps getting your panties wet or running down your legs You have vaginal bleeding.  It is normal to have a small amount of spotting if your cervix was checked.  You don't feel your baby moving like normal.  If you don't, get you something to eat and drink and lay down and focus on feeling your baby move.   If your baby is still not moving like normal, you should call the office or go to [facility].  [place] Pediatricians/Family Doctors: [facility] [phone]           [facility] [phone]                [facility] [phone] (usually not accepting new patients unless you have family there already, you are always welcome to call and ask)      [facility] Department [phone]       [facility] Pediatricians/Family Doctors:  [facility]: [phone] [facility]: [phone] [facility]: [phone]  [facility] Doctors:  [facility]: [phone]  [facility]: [phone]  [facility] Doctors: [facility]: [phone]   Home Blood Pressure Monitoring for Patients   Your provider has recommended that you check your blood pressure (BP) at least once a week at home. If you do not have a blood pressure cuff at home, one will be provided for you. Contact your provider if you have not received your monitor within 1 week.   Helpful Tips for Accurate Home Blood Pressure Checks  Don't smoke, exercise, or drink caffeine 30 minutes before checking your BP Use the restroom before checking your BP (a full bladder can raise your pressure) Relax in a comfortable upright chair Feet on the ground Left arm resting comfortably on a flat surface at the level of your heart Legs uncrossed Back supported Sit quietly and don't talk Place the cuff on your bare arm Adjust snuggly, so that only two fingertips can fit between your skin and the top of the cuff Check 2 readings separated by at least one minute Keep a log of your BP readings For a visual, please reference this diagram: http://ccnc.care/bpdiagram  Provider Name: [facility]     Phone: [phone]  Zone 1: ALL CLEAR  Continue to monitor your symptoms:  BP reading is less than 140 (top number) or less than 90 (bottom number)  No right upper stomach pain No headaches or seeing spots No feeling nauseated or throwing up No swelling in face and hands  Zone 2: CAUTION Call your doctor's office for any of the following:  BP reading is greater than 140 (top number) or greater than 90 (bottom number)  Stomach pain under your ribs in the middle or right side Headaches or seeing spots Feeling nauseated or throwing up Swelling in face and hands  Zone 3: EMERGENCY   Seek immediate medical care if you have any of the following:  BP reading is greater than160 (top number) or greater than 110 (bottom number) Severe headaches not improving with Tylenol Serious difficulty catching your breath Any worsening symptoms from Zone 2   Second Trimester of Pregnancy The second trimester is from week 13 through week 28, months 4 through 6. The second trimester is often a time when you feel your best. Your body has also adjusted to being pregnant, and you begin to feel better physically. Usually, morning sickness has lessened or quit completely, you may have more energy, and you may have an increase in appetite. The second trimester is also a time when the fetus is growing rapidly. At the end of the sixth month, the fetus is about 9 inches long and weighs about 1 pounds. You will likely begin to feel the baby move (quickening) between 18 and 20 weeks of the pregnancy. BODY CHANGES Your body goes through many changes during pregnancy. The changes vary from woman to woman.  Your weight will continue to increase. You will notice your lower abdomen bulging out. You may begin to get stretch marks on your hips, abdomen, and breasts. You may develop headaches that can be relieved by medicines approved by your health care provider. You may urinate more often because the fetus is pressing on your bladder. You may develop or continue to have heartburn as a result of your pregnancy. You may develop constipation because certain hormones are causing the muscles that push waste through your intestines to slow down. You may develop hemorrhoids or swollen, bulging veins (varicose veins). You may have back pain because of the weight gain and pregnancy hormones relaxing your joints between the bones in your pelvis and as a result of a shift in weight and the muscles that support your balance. Your breasts will continue to grow and be tender. Your gums may bleed and may be sensitive to  brushing and flossing. Dark spots or blotches (chloasma, mask of pregnancy) may develop on your face. This will likely fade after the baby is born. A dark line from your belly button to the pubic area (linea nigra) may appear. This will likely fade after the baby is born. You may have changes in your hair. These can include thickening of your hair, rapid growth, and changes in texture. Some women also have hair loss during or after pregnancy, or hair that feels dry or thin. Your hair will most likely return to normal after your baby is born. WHAT TO EXPECT AT YOUR PRENATAL VISITS During a routine prenatal visit: You will be weighed to make sure you and the fetus are growing normally. Your blood pressure will be taken. Your abdomen will be measured to track your baby's growth. The fetal heartbeat will be listened to. Any test results from the previous visit will be discussed. Your health care provider  may ask you: How you are feeling. If you are feeling the baby move. If you have had any abnormal symptoms, such as leaking fluid, bleeding, severe headaches, or abdominal cramping. If you have any questions. Other tests that may be performed during your second trimester include: Blood tests that check for: Low iron levels (anemia). Gestational diabetes (between 24 and 28 weeks). Rh antibodies. Urine tests to check for infections, diabetes, or protein in the urine. An ultrasound to confirm the proper growth and development of the baby. An amniocentesis to check for possible genetic problems. Fetal screens for spina bifida and Down syndrome. HOME CARE INSTRUCTIONS  Avoid all smoking, herbs, alcohol, and unprescribed drugs. These chemicals affect the formation and growth of the baby. Follow your health care provider's instructions regarding medicine use. There are medicines that are either safe or unsafe to take during pregnancy. Exercise only as directed by your health care provider.  Experiencing uterine cramps is a good sign to stop exercising. Continue to eat regular, healthy meals. Wear a good support bra for breast tenderness. Do not use hot tubs, steam rooms, or saunas. Wear your seat belt at all times when driving. Avoid raw meat, uncooked cheese, cat litter boxes, and soil used by cats. These carry germs that can cause birth defects in the baby. Take your prenatal vitamins. Try taking a stool softener (if your health care provider approves) if you develop constipation. Eat more high-fiber foods, such as fresh vegetables or fruit and whole grains. Drink plenty of fluids to keep your urine clear or pale yellow. Take warm sitz baths to soothe any pain or discomfort caused by hemorrhoids. Use hemorrhoid cream if your health care provider approves. If you develop varicose veins, wear support hose. Elevate your feet for 15 minutes, 3-4 times a day. Limit salt in your diet. Avoid heavy lifting, wear low heel shoes, and practice good posture. Rest with your legs elevated if you have leg cramps or low back pain. Visit your dentist if you have not gone yet during your pregnancy. Use a soft toothbrush to brush your teeth and be gentle when you floss. A sexual relationship may be continued unless your health care provider directs you otherwise. Continue to go to all your prenatal visits as directed by your health care provider. SEEK MEDICAL CARE IF:  You have dizziness. You have mild pelvic cramps, pelvic pressure, or nagging pain in the abdominal area. You have persistent nausea, vomiting, or diarrhea. You have a bad smelling vaginal discharge. You have pain with urination. SEEK IMMEDIATE MEDICAL CARE IF:  You have a fever. You are leaking fluid from your vagina. You have spotting or bleeding from your vagina. You have severe abdominal cramping or pain. You have rapid weight gain or loss. You have shortness of breath with chest pain. You notice sudden or extreme swelling  of your face, hands, ankles, feet, or legs. You have not felt your baby move in over an hour. You have severe headaches that do not go away with medicine. You have vision changes. Document Released: 02/23/2001 Document Revised: 03/06/2013 Document Reviewed: 05/02/2012 [facility] Patient Information 2015 [place], [place]. This information is not intended to replace advice given to you by your health care provider. Make sure you discuss any questions you have with your health care provider.

## 2020-12-08 ENCOUNTER — Other Ambulatory Visit: Payer: Medicaid Other

## 2020-12-23 ENCOUNTER — Ambulatory Visit (INDEPENDENT_AMBULATORY_CARE_PROVIDER_SITE_OTHER): Payer: Medicaid Other | Admitting: Women's Health

## 2020-12-23 ENCOUNTER — Other Ambulatory Visit: Payer: Self-pay

## 2020-12-23 ENCOUNTER — Other Ambulatory Visit (HOSPITAL_COMMUNITY)
Admission: RE | Admit: 2020-12-23 | Discharge: 2020-12-23 | Disposition: A | Discharge status: Home / Self Care | Payer: Medicaid Other | Source: Ambulatory Visit | Attending: Women's Health | Admitting: Women's Health

## 2020-12-23 VITALS — BP 130/76 | HR 104 | Wt 176.0 lb

## 2020-12-23 DIAGNOSIS — Z3483 Encounter for supervision of other normal pregnancy, third trimester: Secondary | ICD-10-CM

## 2020-12-23 DIAGNOSIS — Z23 Encounter for immunization: Secondary | ICD-10-CM

## 2020-12-23 DIAGNOSIS — N939 Abnormal uterine and vaginal bleeding, unspecified: Secondary | ICD-10-CM | POA: Insufficient documentation

## 2020-12-23 DIAGNOSIS — Z3480 Encounter for supervision of other normal pregnancy, unspecified trimester: Secondary | ICD-10-CM

## 2020-12-23 DIAGNOSIS — Z3A30 30 weeks gestation of pregnancy: Secondary | ICD-10-CM

## 2020-12-23 NOTE — Progress Notes (Signed)
LOW-RISK PREGNANCY VISIT Patient name: Sara Foster MRN [id number]  Date of birth: 1995/03/27 Chief Complaint:   Routine Prenatal Visit  History of Present Illness:   Sara Foster is a 25 y.o. G80P1001 female at [redacted]w[redacted]d with an Estimated Date of Delivery: 03/02/21 being seen today for ongoing management of a low-risk pregnancy.   Today she reports  bright red spotting when waking up this am, none since. Last sex 'long time ago'.  Denies abnormal discharge, itching/odor/irritation. No constipation. Hasn't done pn2 (had to reschedule d/t sick child)   Contractions: Irritability. Vag. Bleeding: Scant.  Movement: Present. denies leaking of fluid.  Depression screen [facility] 2/9 08/22/2020 08/28/2018  Decreased Interest 3 0  Down, Depressed, Hopeless 2 0  PHQ - 2 Score 5 0  Altered sleeping 3 -  Tired, decreased energy 3 -  Change in appetite 2 -  Feeling bad or failure about yourself  1 -  Trouble concentrating 3 -  Moving slowly or fidgety/restless 2 -  Suicidal thoughts 0 -  PHQ-9 Score 19 -     GAD 7 : Generalized Anxiety Score 08/22/2020  Nervous, Anxious, on Edge 3  Control/stop worrying 2  Worry too much - different things 3  Trouble relaxing 3  Restless 2  Easily annoyed or irritable 3  Afraid - awful might happen 0  Total GAD 7 Score 16      Review of Systems:   Pertinent items are noted in HPI Denies abnormal vaginal discharge w/ itching/odor/irritation, headaches, visual changes, shortness of breath, chest pain, abdominal pain, severe nausea/vomiting, or problems with urination or bowel movements unless otherwise stated above. Pertinent History Reviewed:  Reviewed past medical,surgical, social, obstetrical and family history.  Reviewed problem list, medications and allergies. Physical Assessment:   Vitals:   12/23/20 0945  BP: 130/76  Pulse: (!) 104  Weight: 176 lb (79.8 kg)  Body mass index is 31.18 kg/m.        Physical Examination:   General  appearance: Well appearing, and in no distress  Mental status: Alert, oriented to person, place, and time  Skin: Warm & dry  Cardiovascular: Normal heart rate noted  Respiratory: Normal respiratory effort, no distress  Abdomen: Soft, gravid, nontender  Pelvic:  spec exam: cx visually long/closed, no bleeding, CV swab collected          Extremities: Edema: None  Fetal Status: Fetal Heart Rate (bpm): 142 Fundal Height: 30 cm Movement: Present    Chaperone: Faith Rogue   No results found for this or any previous visit (from the past 24 hour(s)).  Assessment & Plan:  1) Low-risk pregnancy G2P1001 at [redacted]w[redacted]d with an Estimated Date of Delivery: 03/02/21   2) BRB this am, none now, normal exam, CV swab sent. Pelvic rest x 7d. Reviewed reasons to seek care  3) Elevated AFP> efw q4wks (last 86% at 27.4wks)   Meds: No orders of the defined types were placed in this encounter.  Labs/procedures today: spec exam, CV swab, and flu shot  Plan:  Continue routine obstetrical care  Next visit: prefers in person    Reviewed: Preterm labor symptoms and general obstetric precautions including but not limited to vaginal bleeding, contractions, leaking of fluid and fetal movement were reviewed in detail with the patient.  All questions were answered.   Follow-up: Return for ASAP pn2 (no visit), 1wk efw w/ MFM (none available here); 2wk LROB w/ CNM in person here.  Future Appointments  Date Time Provider  Department Center  12/25/2020  8:30 AM [facility]  01/07/2021  2:30 PM Cheral Marker, CNM [facility]    Orders Placed This Encounter  Procedures   Flu Vaccine QUAD 54mo+IM (Fluarix, Fluzone & Alfiuria Quad PF)   Cheral Marker CNM, WHNP-BC 12/23/2020 10:20 AM

## 2020-12-24 LAB — CERVICOVAGINAL ANCILLARY ONLY
Bacterial Vaginitis (gardnerella): NEGATIVE
Candida Glabrata: NEGATIVE
Candida Vaginitis: POSITIVE — AB
Chlamydia: NEGATIVE
Comment: NEGATIVE
Comment: NEGATIVE
Comment: NEGATIVE
Comment: NEGATIVE
Comment: NEGATIVE
Comment: NORMAL
Neisseria Gonorrhea: NEGATIVE
Trichomonas: NEGATIVE

## 2020-12-25 ENCOUNTER — Other Ambulatory Visit: Payer: Medicaid Other

## 2020-12-26 LAB — GLUCOSE TOLERANCE, 2 HOURS W/ 1HR
Glucose, 1 hour: 120 mg/dL (ref 70–179)
Glucose, 2 hour: 115 mg/dL (ref 70–152)
Glucose, Fasting: 71 mg/dL (ref 70–91)

## 2020-12-26 LAB — ANTIBODY SCREEN: Antibody Screen: NEGATIVE

## 2020-12-26 LAB — CBC
Hematocrit: 36 % (ref 34.0–46.6)
Hemoglobin: 12.1 g/dL (ref 11.1–15.9)
MCH: 30.4 pg (ref 26.6–33.0)
MCHC: 33.6 g/dL (ref 31.5–35.7)
MCV: 91 fL (ref 79–97)
Platelets: 266 10*3/uL (ref 150–450)
RBC: 3.98 x10E6/uL (ref 3.77–5.28)
RDW: 13.1 % (ref 11.7–15.4)
WBC: 11.9 10*3/uL — ABNORMAL HIGH (ref 3.4–10.8)

## 2020-12-26 LAB — RPR: RPR Ser Ql: NONREACTIVE

## 2020-12-26 LAB — HIV ANTIBODY (ROUTINE TESTING W REFLEX): HIV Screen 4th Generation wRfx: NONREACTIVE

## 2020-12-27 ENCOUNTER — Inpatient Hospital Stay (HOSPITAL_COMMUNITY)
Admission: AD | Admit: 2020-12-27 | Discharge: 2020-12-27 | Disposition: A | Discharge status: Home / Self Care | Payer: Medicaid Other | Attending: Family Medicine | Admitting: Family Medicine

## 2020-12-27 ENCOUNTER — Encounter (HOSPITAL_COMMUNITY): Payer: Self-pay | Admitting: Family Medicine

## 2020-12-27 ENCOUNTER — Other Ambulatory Visit: Payer: Self-pay

## 2020-12-27 DIAGNOSIS — R109 Unspecified abdominal pain: Secondary | ICD-10-CM | POA: Diagnosis not present

## 2020-12-27 DIAGNOSIS — O4693 Antepartum hemorrhage, unspecified, third trimester: Secondary | ICD-10-CM | POA: Diagnosis not present

## 2020-12-27 DIAGNOSIS — Z3A30 30 weeks gestation of pregnancy: Secondary | ICD-10-CM | POA: Diagnosis not present

## 2020-12-27 DIAGNOSIS — Z8619 Personal history of other infectious and parasitic diseases: Secondary | ICD-10-CM | POA: Diagnosis not present

## 2020-12-27 DIAGNOSIS — O26893 Other specified pregnancy related conditions, third trimester: Secondary | ICD-10-CM | POA: Diagnosis present

## 2020-12-27 DIAGNOSIS — O162 Unspecified maternal hypertension, second trimester: Secondary | ICD-10-CM

## 2020-12-27 DIAGNOSIS — Z3480 Encounter for supervision of other normal pregnancy, unspecified trimester: Secondary | ICD-10-CM

## 2020-12-27 DIAGNOSIS — Z3689 Encounter for other specified antenatal screening: Secondary | ICD-10-CM

## 2020-12-27 LAB — URINALYSIS, ROUTINE W REFLEX MICROSCOPIC
Bacteria, UA: NONE SEEN
Bilirubin Urine: NEGATIVE
Glucose, UA: NEGATIVE mg/dL
Ketones, ur: NEGATIVE mg/dL
Nitrite: NEGATIVE
Protein, ur: 30 mg/dL — AB
RBC / HPF: >50 RBC/hpf — ABNORMAL HIGH (ref 0–5)
Specific Gravity, Urine: 1.021 (ref 1.005–1.030)
pH: 6 (ref 5.0–8.0)

## 2020-12-27 MED ORDER — BETAMETHASONE SOD PHOS & ACET 6 (3-3) MG/ML IJ SUSP
12.0000 mg | Freq: Once | INTRAMUSCULAR | Status: AC
  Administered 2020-12-27: 12 mg via INTRAMUSCULAR
  Filled 2020-12-27: qty 5

## 2020-12-27 MED ORDER — NIFEDIPINE 10 MG PO CAPS: 10.0000 mg | ORAL_CAPSULE | ORAL | 0 refills | Status: DC | PRN

## 2020-12-27 NOTE — MAU Note (Signed)
Went to the bathroom and she was bleeding, more than spotting, more like a period.  Was told she had a yeast infection, had spotting early in wk, when at dr.  Has used monistat on Thu and Fri night.  Having some abd pain, like a period cramp but worse. No placental issues on [name].

## 2020-12-27 NOTE — MAU Provider Note (Addendum)
History     CSN: [id number]  Arrival date and time: 12/27/20 1628   Event Date/Time   First Provider Initiated Contact with Patient 12/27/20 1832      Chief Complaint  Patient presents with   Vaginal Bleeding   Abdominal Pain   [name] is a 25 y.o. year old G57P1001 female at 81w5dweeks gestation who presents to MAU reporting vaginal bleeding after using the BR; "more than spotting -- like a period." She had spotting earlier this week at her OB's office. She was recently dx'd with a yeast infection. She used Monistat 10/13 and 10/14. She also complains of some abdominal cramping/pain; "like period cramps." Her sister is present and contributing to the history taking.    OB History     Gravida  2   Para  1   Term  1   Preterm      AB      Living  1      SAB      IAB      Ectopic      Multiple      Live Births  1           Past Medical History:  Diagnosis Date   Chlamydia 2015   Encounter for education about contraceptive use 03/24/2015   Generalized anxiety disorder    Gonorrhea 2015   Low back pain    Migraines    OAB (overactive bladder) 05/06/2014    Past Surgical History:  Procedure Laterality Date   WISDOM TOOTH EXTRACTION      Family History  Problem Relation Age of Onset   Asthma Mother    COPD Father    Lung disease Father    Asthma Daughter    Breast cancer Maternal Aunt    Arthritis Maternal Grandmother    COPD Maternal Grandfather    Lung disease Maternal Grandfather    Heart disease Paternal Grandfather    Cancer Other        maternal great grandma   Migraines Other     Social History   Tobacco Use   Smoking status: Never   Smokeless tobacco: Never  Vaping Use   Vaping Use: Never used  Substance Use Topics   Alcohol use: Not Currently    Comment: very rarely   Drug use: No    Allergies: No Known Allergies  Medications Prior to Admission  Medication Sig Dispense Refill Last Dose   Blood Pressure  Monitoring (BLOOD PRESSURE KIT) DEVI 1 Units by Does not apply route once a week. 1 each 0    cyclobenzaprine (FLEXERIL) 5 MG tablet Take 1 tablet (5 mg total) by mouth 3 (three) times daily as needed for muscle spasms. (Patient not taking: Reported on 12/23/2020) 30 tablet 0    doxylamine, Sleep, (UNISOM) 25 MG tablet Take 25 mg by mouth at bedtime as needed. (Patient not taking: No sig reported)      DULoxetine (CYMBALTA) 60 MG capsule Take 1 capsule (60 mg total) by mouth daily. 90 capsule 3    metoCLOPramide (REGLAN) 10 MG tablet Take 1 tablet (10 mg total) by mouth every 6 (six) hours. (Patient not taking: No sig reported) 30 tablet 0    montelukast (SINGULAIR) 10 MG tablet Take 10 mg by mouth daily.      Prenatal Vit-Fe Fumarate-FA (MULTIVITAMIN-PRENATAL) 27-0.8 MG TABS tablet Take 1 tablet by mouth daily at 12 noon.      promethazine (PHENERGAN) 25 MG  tablet TAKE 1 TABLET BY MOUTH EVERY 6 HOURS AS NEEDED FOR NAUSEA AND VOMITING 30 tablet 1     Review of Systems  Constitutional: Negative.   HENT: Negative.    Eyes: Negative.   Respiratory: Negative.    Cardiovascular: Negative.   Gastrointestinal: Negative.   Endocrine: Negative.   Genitourinary:  Positive for pelvic pain (cramping; "like period cramps") and vaginal bleeding ("like a period").  Musculoskeletal: Negative.   Skin: Negative.   Allergic/Immunologic: Negative.   Neurological: Negative.   Hematological: Negative.   Psychiatric/Behavioral: Negative.    Physical Exam   Blood pressure 140/75, pulse (!) 109, temperature 98.1 F (36.7 C), temperature source Oral, resp. rate 18, weight 79.3 kg, last menstrual period 05/26/2020, SpO2 99 %.  Physical Exam Vitals and nursing note reviewed. Exam conducted with a chaperone present.  Constitutional:      Appearance: Normal appearance. She is normal weight.  Cardiovascular:     Rate and Rhythm: Normal rate.  Pulmonary:     Effort: Pulmonary effort is normal.  Abdominal:      Palpations: Abdomen is soft.  Genitourinary:    General: Normal vulva.     Comments: Pelvic exam: External genitalia normal, SE: vaginal walls pink and well rugated, cervix is smooth, pink, no lesions, small amt of mucousy, light red blood removed with a few large cotton swabs -- Uterus is non-tender, S=D, no CMT or friability, no adnexal tenderness. Dilation: 2.5  Effacement (%): 60 Station: -2  Presentation: Vertex  Exam by: Laury Deep CNM  Musculoskeletal:        General: Normal range of motion.  Skin:    General: Skin is warm and dry.  Neurological:     Mental Status: She is alert and oriented to person, place, and time.  Psychiatric:        Mood and Affect: Mood normal.        Behavior: Behavior normal.        Thought Content: Thought content normal.        Judgment: Judgment normal.   REACTIVE NST - FHR: 140 bpm / moderate variability / accels present / decels absent / TOCO: none traced   MAU Course  Procedures  MDM Speculum Exam  BMZ 12 mg IM  Results for orders placed or performed during the hospital encounter of 12/27/20 (from the past 24 hour(s))  Urinalysis, Routine w reflex microscopic     Status: Abnormal   Collection Time: 12/27/20  4:58 PM  Result Value Ref Range   Color, Urine YELLOW YELLOW   APPearance HAZY (A) CLEAR   Specific Gravity, Urine 1.021 1.005 - 1.030   pH 6.0 5.0 - 8.0   Glucose, UA NEGATIVE NEGATIVE mg/dL   Hgb urine dipstick LARGE (A) NEGATIVE   Bilirubin Urine NEGATIVE NEGATIVE   Ketones, ur NEGATIVE NEGATIVE mg/dL   Protein, ur 30 (A) NEGATIVE mg/dL   Nitrite NEGATIVE NEGATIVE   Leukocytes,Ua MODERATE (A) NEGATIVE   RBC / HPF >50 (H) 0 - 5 RBC/hpf   WBC, UA 6-10 0 - 5 WBC/hpf   Bacteria, UA NONE SEEN NONE SEEN   Squamous Epithelial / LPF 0-5 0 - 5   Mucus PRESENT    Ca Oxalate Crys, UA PRESENT    Report given to and care assumed by Gavin Pound, CNM  Laury Deep, CNM 12/27/2020, 6:51 PM  Assessment and Plan    Reassessment (9:15 PM)  -Cervical exam remains the same with exception of cervix now being anterior. -  Patient informed of follow up at [facility] on Monday for 2nd BMZ dosing. -Discussed strict bed rest including no driving to and from appts. -Will send script for Procardia dosing for home usage. -POC reviewed with Dr. Elonda Husky who agrees and reports that message should be sent to [facility] [email or web address] for scheduling of patient BMZ. -Encouraged to call primary office or return to MAU if symptoms worsen or with the onset of new symptoms. -Discharged to home in stable condition.

## 2020-12-29 ENCOUNTER — Other Ambulatory Visit: Payer: Self-pay | Admitting: Women's Health

## 2020-12-29 ENCOUNTER — Encounter: Payer: Self-pay | Admitting: *Deleted

## 2020-12-29 ENCOUNTER — Other Ambulatory Visit (INDEPENDENT_AMBULATORY_CARE_PROVIDER_SITE_OTHER): Payer: Medicaid Other | Admitting: Obstetrics & Gynecology

## 2020-12-29 ENCOUNTER — Other Ambulatory Visit: Payer: Self-pay

## 2020-12-29 VITALS — BP 119/78 | HR 114 | Ht 63.0 in | Wt 176.0 lb

## 2020-12-29 DIAGNOSIS — O09213 Supervision of pregnancy with history of pre-term labor, third trimester: Secondary | ICD-10-CM | POA: Diagnosis not present

## 2020-12-29 DIAGNOSIS — Z3A31 31 weeks gestation of pregnancy: Secondary | ICD-10-CM

## 2020-12-29 DIAGNOSIS — R772 Abnormality of alphafetoprotein: Secondary | ICD-10-CM

## 2020-12-29 DIAGNOSIS — Z3480 Encounter for supervision of other normal pregnancy, unspecified trimester: Secondary | ICD-10-CM | POA: Diagnosis not present

## 2020-12-29 DIAGNOSIS — Z3483 Encounter for supervision of other normal pregnancy, third trimester: Secondary | ICD-10-CM

## 2020-12-29 DIAGNOSIS — O162 Unspecified maternal hypertension, second trimester: Secondary | ICD-10-CM

## 2020-12-29 MED ORDER — BETAMETHASONE SOD PHOS & ACET 6 (3-3) MG/ML IJ SUSP
12.0000 mg | Freq: Once | INTRAMUSCULAR | Status: AC
  Administered 2020-12-29: 12 mg via INTRAMUSCULAR

## 2020-12-29 NOTE — Progress Notes (Signed)
[place] PREGNANCY VISIT Patient name: Sara Foster MRN [id number]  Date of birth: [birth year] Chief Complaint:   Routine Prenatal Visit ([place] today)  History of Present Illness:   Sara Foster is a 25 y.o. G29P1001 female at 52w0dwith an Estimated Date of Delivery: 03/02/21 being seen today for ongoing management of a high-risk pregnancy complicated by: -Preterm labor Seen in MAU 10/15- noted to have bleeding and dilated to 2.5cm.  Sent home with precautions.  Today she reports  still occasional contractions, but not consistently.  Still having light spotting . Yesterday, noted more regular contractions took Procardia around 4:30pm, no further medication since that time. 2nd dose of BMZ given today Contractions: Irregular. Vag. Bleeding: Small.  Movement: Present. denies leaking of fluid.   Depression screen [facility]2/9 08/22/2020 08/28/2018  Decreased Interest 3 0  Down, Depressed, Hopeless 2 0  PHQ - 2 Score 5 0  Altered sleeping 3 -  Tired, decreased energy 3 -  Change in appetite 2 -  Feeling bad or failure about yourself  1 -  Trouble concentrating 3 -  Moving slowly or fidgety/restless 2 -  Suicidal thoughts 0 -  PHQ-9 Score 19 -     Current Outpatient Medications  Medication Instructions   Blood Pressure Monitoring (BLOOD PRESSURE KIT) DEVI 1 Units, Does not apply, Weekly   DULoxetine (CYMBALTA) 60 mg, Oral, Daily   montelukast (SINGULAIR) 10 mg, Oral, Daily   NIFEdipine (PROCARDIA) 10 mg, Oral, Every 4 hours PRN   Prenatal Vit-Fe Fumarate-FA (MULTIVITAMIN-PRENATAL) 27-0.8 MG TABS tablet 1 tablet, Oral, Daily   promethazine (PHENERGAN) 25 MG tablet TAKE 1 TABLET BY MOUTH EVERY 6 HOURS AS NEEDED FOR NAUSEA AND VOMITING     Review of Systems:   Pertinent items are noted in HPI Denies abnormal vaginal discharge w/ itching/odor/irritation- in the process of yeast treatment, headaches, visual changes, shortness of breath, chest pain, abdominal pain, severe  nausea/vomiting, or problems with urination or bowel movements unless otherwise stated above. Pertinent History Reviewed:  Reviewed past medical,surgical, social, obstetrical and family history.  Reviewed problem list, medications and allergies. Physical Assessment:   Vitals:   12/29/20 0839  BP: 119/78  Pulse: (!) 114  Weight: 176 lb (79.8 kg)  Height: '5\' 3"'  (1.6 m)  Body mass index is 31.18 kg/m.           Physical Examination:   General appearance: alert, well appearing, and in no distress  Mental status: alert, oriented to person, place, and time  Skin: warm & dry   Extremities: Edema: None    Cardiovascular: normal heart rate noted  Respiratory: normal respiratory effort, no distress  Abdomen: gravid, soft, non-tender  Pelvic: Cervical exam performed  Dilation: 2.5 Effacement (%): 60 Station: -2  Fetal Status: Fetal Heart Rate (bpm): 150 Fundal Height: 30 cm Movement: Present Presentation: Vertex  Fetal Surveillance Testing today: doppler   Chaperone:  declined     No results found for this or any previous visit (from the past 24 hour(s)).   Assessment & Plan:  High-risk pregnancy: G2P1001 at 368w0dith an Estimated Date of Delivery: 03/02/21   1) Preterm labor -reassured pt that no further cervical change noted -reviewed preterm labor precautions and will write work note -follow up as scheduled in one week- if clinically stable may then go to every 2wks visits -ok to complete yeast treatment  Meds:  Meds ordered this encounter  Medications   betamethasone acetate-betamethasone sodium phosphate (CELESTONE) injection 12 mg  Labs/procedures today: none  Treatment Plan:  continue procardia prn, routine OB care  Reviewed: Preterm labor symptoms and general obstetric precautions including but not limited to vaginal bleeding, contractions, leaking of fluid and fetal movement were reviewed in detail with the patient.  All questions were answered. Pt has home bp  cuff. Check bp weekly, let us know if >140/90.   Follow-up: Return in about 1 week (around 01/05/2021) for as scheduled- 1wk.   Future Appointments  Date Time Provider [place]  12/30/2020  3:30 PM [facility] NURSE [facility]  12/30/2020  3:45 PM [facility] US1 [facility]  01/07/2021  2:30 PM Roma Schanz, CNM [facility]  01/21/2021  2:00 PM [facility] - [facility]TO[facility]YN [name] [facility] None  01/21/2021  2:30 PM Roma Schanz, CNM [facility]    Janyth Pupa, DO Attending [place] for [facility] [place]

## 2020-12-30 ENCOUNTER — Ambulatory Visit: Payer: Medicaid Other | Attending: Women's Health

## 2020-12-30 ENCOUNTER — Ambulatory Visit: Payer: Medicaid Other | Admitting: *Deleted

## 2020-12-30 ENCOUNTER — Encounter: Payer: Self-pay | Admitting: *Deleted

## 2020-12-30 VITALS — BP 117/62 | HR 88

## 2020-12-30 DIAGNOSIS — O162 Unspecified maternal hypertension, second trimester: Secondary | ICD-10-CM

## 2020-12-30 DIAGNOSIS — Z3480 Encounter for supervision of other normal pregnancy, unspecified trimester: Secondary | ICD-10-CM | POA: Insufficient documentation

## 2020-12-30 DIAGNOSIS — Z3483 Encounter for supervision of other normal pregnancy, third trimester: Secondary | ICD-10-CM | POA: Insufficient documentation

## 2020-12-30 DIAGNOSIS — Z3A31 31 weeks gestation of pregnancy: Secondary | ICD-10-CM | POA: Diagnosis not present

## 2020-12-30 DIAGNOSIS — R772 Abnormality of alphafetoprotein: Secondary | ICD-10-CM | POA: Insufficient documentation

## 2020-12-30 DIAGNOSIS — O4693 Antepartum hemorrhage, unspecified, third trimester: Secondary | ICD-10-CM

## 2020-12-31 ENCOUNTER — Other Ambulatory Visit: Payer: Self-pay | Admitting: *Deleted

## 2020-12-31 DIAGNOSIS — O4693 Antepartum hemorrhage, unspecified, third trimester: Secondary | ICD-10-CM

## 2020-12-31 DIAGNOSIS — R772 Abnormality of alphafetoprotein: Secondary | ICD-10-CM

## 2021-01-07 ENCOUNTER — Other Ambulatory Visit: Payer: Self-pay

## 2021-01-07 ENCOUNTER — Ambulatory Visit (INDEPENDENT_AMBULATORY_CARE_PROVIDER_SITE_OTHER): Payer: Medicaid Other | Admitting: Women's Health

## 2021-01-07 ENCOUNTER — Encounter: Payer: Self-pay | Admitting: Women's Health

## 2021-01-07 VITALS — BP 115/72 | HR 103 | Wt 176.0 lb

## 2021-01-07 DIAGNOSIS — Z3480 Encounter for supervision of other normal pregnancy, unspecified trimester: Secondary | ICD-10-CM

## 2021-01-07 DIAGNOSIS — Z3A32 32 weeks gestation of pregnancy: Secondary | ICD-10-CM

## 2021-01-07 DIAGNOSIS — R772 Abnormality of alphafetoprotein: Secondary | ICD-10-CM

## 2021-01-07 NOTE — Patient Instructions (Signed)
Sara Foster, thank you for choosing our office today! We appreciate the opportunity to meet your healthcare needs. You may receive a short survey by mail, e-mail, or through [employer]. If you are happy with your care we would appreciate if you could take just a few minutes to complete the survey questions. We read all of your comments and take your feedback very seriously. Thank you again for choosing our office.  Center for [employer] Team at [facility]  [facility] at [facility] ([address] [place], [place] [address]) Entrance C, located off of [address] Free 24/7 valet parking   CLASSES: Go to [email or web address] to register for classes (childbirth, breastfeeding, waterbirth, infant CPR, daddy bootcamp, etc.)  Call the office [phone]) or go to [facility] if: You begin to have strong, frequent contractions Your water breaks.  Sometimes it is a big gush of fluid, sometimes it is just a trickle that keeps getting your panties wet or running down your legs You have vaginal bleeding.  It is normal to have a small amount of spotting if your cervix was checked.  You don't feel your baby moving like normal.  If you don't, get you something to eat and drink and lay down and focus on feeling your baby move.   If your baby is still not moving like normal, you should call the office or go to [facility].  Call the office [phone]) or go to [facility] hospital for these signs of pre-eclampsia: Severe headache that does not go away with Tylenol Visual changes- seeing spots, double, blurred vision Pain under your right breast or upper abdomen that does not go away with Tums or heartburn medicine Nausea and/or vomiting Severe swelling in your hands, feet, and face   Tdap Vaccine It is recommended that you get the Tdap vaccine during the third trimester of EACH pregnancy to help protect your baby from getting pertussis (whooping cough) 27-36 weeks is the BEST time to do  this so that you can pass the protection on to your baby. During pregnancy is better than after pregnancy, but if you are unable to get it during pregnancy it will be offered at the hospital.  You can get this vaccine with [name], at the health department, your family doctor, or some local pharmacies Everyone who will be around your baby should also be up-to-date on their vaccines before the baby comes. Adults (who are not pregnant) only need 1 dose of Tdap during adulthood.   [facility] Pediatricians/Family Doctors [facility]): [address] Colette Ribas, [phone]           [facility]: [address], [phone]                [facility] Medicine [facility]): [address], [phone] (call to ask if accepting patients) [facility] Department: [address], [place], [phone]    [facility] Pediatricians/Family Doctors [facility]): [phone], [phone] [facility]: [address] [place], [phone] [facility]: [address], [phone]  [facility] Doctors  [facility]): [phone] [facility]: [address], [phone]   [facility] Doctors [facility]: [address], [phone]  [facility] Family Doctors  [facility]: [phone], [phone]  Home Blood Pressure Monitoring for Patients   Your provider has recommended that you check your  blood pressure (BP) at least once a week at home. If you do not have a blood pressure cuff at home, one will be provided for you. Contact your provider if you have not received your monitor within 1 week.   Helpful Tips for Accurate Home Blood Pressure Checks  Don't smoke, exercise, or drink caffeine 30 minutes before checking your BP Use the restroom before checking your BP (a full bladder can raise your  pressure) Relax in a comfortable upright chair Feet on the ground Left arm resting comfortably on a flat surface at the level of your heart Legs uncrossed Back supported Sit quietly and don't talk Place the cuff on your bare arm Adjust snuggly, so that only two fingertips can fit between your skin and the top of the cuff Check 2 readings separated by at least one minute Keep a log of your BP readings For a visual, please reference this diagram: http://ccnc.care/bpdiagram  Provider Name: [facility]     Phone: [phone]  Zone 1: ALL CLEAR  Continue to monitor your symptoms:  BP reading is less than 140 (top number) or less than 90 (bottom number)  No right upper stomach pain No headaches or seeing spots No feeling nauseated or throwing up No swelling in face and hands  Zone 2: CAUTION Call your doctor's office for any of the following:  BP reading is greater than 140 (top number) or greater than 90 (bottom number)  Stomach pain under your ribs in the middle or right side Headaches or seeing spots Feeling nauseated or throwing up Swelling in face and hands  Zone 3: EMERGENCY  Seek immediate medical care if you have any of the following:  BP reading is greater than160 (top number) or greater than 110 (bottom number) Severe headaches not improving with Tylenol Serious difficulty catching your breath Any worsening symptoms from Zone 2  Preterm Labor and Birth Information  The normal length of a pregnancy is 39-41 weeks. Preterm labor is when labor starts before 37 completed weeks of pregnancy. What are the risk factors for preterm labor? Preterm labor is more likely to occur in women who: Have certain infections during pregnancy such as a bladder infection, sexually transmitted infection, or infection inside the uterus (chorioamnionitis). Have a shorter-than-normal cervix. Have gone into preterm labor before. Have had surgery on their cervix. Are younger than age 17  or older than age 35. Are African American. Are pregnant with twins or multiple babies (multiple gestation). Take street drugs or smoke while pregnant. Do not gain enough weight while pregnant. Became pregnant shortly after having been pregnant. What are the symptoms of preterm labor? Symptoms of preterm labor include: Cramps similar to those that can happen during a menstrual period. The cramps may happen with diarrhea. Pain in the abdomen or lower back. Regular uterine contractions that may feel like tightening of the abdomen. A feeling of increased pressure in the pelvis. Increased watery or bloody mucus discharge from the vagina. Water breaking (ruptured amniotic sac). Why is it important to recognize signs of preterm labor? It is important to recognize signs of preterm labor because babies who are born prematurely may not be fully developed. This can put them at an increased risk for: Long-term (chronic) heart and lung problems. Difficulty immediately after birth with regulating body systems, including blood sugar, body temperature, heart rate, and breathing rate. Bleeding in the brain. Cerebral palsy. Learning difficulties. Death. These risks are highest for babies who are born before 34 weeks   of pregnancy. How is preterm labor treated? Treatment depends on the length of your pregnancy, your condition, and the health of your baby. It may involve: Having a stitch (suture) placed in your cervix to prevent your cervix from opening too early (cerclage). Taking or being given medicines, such as: Hormone medicines. These may be given early in pregnancy to help support the pregnancy. Medicine to stop contractions. Medicines to help mature the baby's lungs. These may be prescribed if the risk of delivery is high. Medicines to prevent your baby from developing cerebral palsy. If the labor happens before 34 weeks of pregnancy, you may need to stay in the hospital. What should I do if I  think I am in preterm labor? If you think that you are going into preterm labor, call your health care provider right away. How can I prevent preterm labor in future pregnancies? To increase your chance of having a full-term pregnancy: Do not use any tobacco products, such as cigarettes, chewing tobacco, and e-cigarettes. If you need help quitting, ask your health care provider. Do not use street drugs or medicines that have not been prescribed to you during your pregnancy. Talk with your health care provider before taking any herbal supplements, even if you have been taking them regularly. Make sure you gain a healthy amount of weight during your pregnancy. Watch for infection. If you think that you might have an infection, get it checked right away. Make sure to tell your health care provider if you have gone into preterm labor before. This information is not intended to replace advice given to you by your health care provider. Make sure you discuss any questions you have with your health care provider. Document Revised: 06/23/2018 Document Reviewed: 07/23/2015 Elsevier Patient Education  2020 Elsevier Inc.   

## 2021-01-07 NOTE — Progress Notes (Signed)
Dark red vaginal discharge

## 2021-01-07 NOTE — Progress Notes (Signed)
LOW-RISK PREGNANCY VISIT Patient name: Sara Foster MRN [id number]  Date of birth: 1995/06/07 Chief Complaint:   Routine Prenatal Visit (Stomach pain; dark red discharge)  History of Present Illness:   Sara Foster is a 25 y.o. G32P1001 female at [redacted]w[redacted]d with an Estimated Date of Delivery: 03/02/21 being seen today for ongoing management of a low-risk pregnancy.   Today she reports  went to MAU 10/15 w/ vag bleeding cx 2.5/60/02, given bmz x 2, procardia rx. Has only had to take once. Occ sharp abd pain. No consistent contractions. Dark red d/c, last time few days ago. . Contractions: Irregular. Vag. Bleeding: None.  Movement: Present. denies leaking of fluid.  Depression screen [facility] 2/9 08/22/2020 08/28/2018  Decreased Interest 3 0  Down, Depressed, Hopeless 2 0  PHQ - 2 Score 5 0  Altered sleeping 3 -  Tired, decreased energy 3 -  Change in appetite 2 -  Feeling bad or failure about yourself  1 -  Trouble concentrating 3 -  Moving slowly or fidgety/restless 2 -  Suicidal thoughts 0 -  PHQ-9 Score 19 -     GAD 7 : Generalized Anxiety Score 08/22/2020  Nervous, Anxious, on Edge 3  Control/stop worrying 2  Worry too much - different things 3  Trouble relaxing 3  Restless 2  Easily annoyed or irritable 3  Afraid - awful might happen 0  Total GAD 7 Score 16      Review of Systems:   Pertinent items are noted in HPI Denies abnormal vaginal discharge w/ itching/odor/irritation, headaches, visual changes, shortness of breath, chest pain, abdominal pain, severe nausea/vomiting, or problems with urination or bowel movements unless otherwise stated above. Pertinent History Reviewed:  Reviewed past medical,surgical, social, obstetrical and family history.  Reviewed problem list, medications and allergies. Physical Assessment:   Vitals:   01/07/21 1446  BP: 115/72  Pulse: (!) 103  Weight: 176 lb (79.8 kg)  Body mass index is 31.18 kg/m.        Physical Examination:    General appearance: Well appearing, and in no distress  Mental status: Alert, oriented to person, place, and time  Skin: Warm & dry  Cardiovascular: Normal heart rate noted  Respiratory: Normal respiratory effort, no distress  Abdomen: Soft, gravid, nontender  Pelvic:  spec exam: no bleeding   Dilation: 3 Effacement (%): 50 Station: -2  Extremities: Edema: None  Fetal Status: Fetal Heart Rate (bpm): 146 Fundal Height: 32 cm Movement: Present Presentation: Vertex  Chaperone: Malachy Mood   No results found for this or any previous visit (from the past 24 hour(s)).  Assessment & Plan:  1) Low-risk pregnancy G2P1001 at [redacted]w[redacted]d with an Estimated Date of Delivery: 03/02/21   2) Preterm cervical dilation, s/p BMZ x 2, has procardia prn. Stay hydrated, pelvic rest. Discussed ptl s/s, reasons to seek care  3) Elevated AFP> EFW 87% @ 31wks w/ MFM   Meds: No orders of the defined types were placed in this encounter.  Labs/procedures today: spec exam and SVE  Plan:  Continue routine obstetrical care  Next visit: prefers in person    Reviewed: Preterm labor symptoms and general obstetric precautions including but not limited to vaginal bleeding, contractions, leaking of fluid and fetal movement were reviewed in detail with the patient.  All questions were answered. Does have home bp cuff. Office bp cuff given: not applicable. Check bp weekly, let us know if consistently >140 and/or >90.  Follow-up: Return for As  scheduled (cancel 11/18 w/ MFM please).  Future Appointments  Date Time Provider Department Center  01/21/2021  2:00 PM [facility] - [facility] [name] [facility] None  01/21/2021  2:30 PM Cheral Marker, CNM [facility]  01/30/2021  3:15 PM [facility] NURSE [facility]  01/30/2021  3:30 PM [facility] US3 [facility]    No orders of the defined types were placed in this encounter.  Cheral Marker CNM, [facility] 01/07/2021 3:19 PM

## 2021-01-20 ENCOUNTER — Other Ambulatory Visit: Payer: Self-pay | Admitting: Women's Health

## 2021-01-20 DIAGNOSIS — R772 Abnormality of alphafetoprotein: Secondary | ICD-10-CM

## 2021-01-21 ENCOUNTER — Encounter: Payer: Self-pay | Admitting: Women's Health

## 2021-01-21 ENCOUNTER — Ambulatory Visit (INDEPENDENT_AMBULATORY_CARE_PROVIDER_SITE_OTHER): Payer: Medicaid Other

## 2021-01-21 ENCOUNTER — Ambulatory Visit (INDEPENDENT_AMBULATORY_CARE_PROVIDER_SITE_OTHER): Payer: Medicaid Other | Admitting: Women's Health

## 2021-01-21 ENCOUNTER — Other Ambulatory Visit: Payer: Self-pay

## 2021-01-21 VITALS — BP 131/80 | HR 112 | Wt 177.6 lb

## 2021-01-21 DIAGNOSIS — Z3483 Encounter for supervision of other normal pregnancy, third trimester: Secondary | ICD-10-CM

## 2021-01-21 DIAGNOSIS — O162 Unspecified maternal hypertension, second trimester: Secondary | ICD-10-CM

## 2021-01-21 DIAGNOSIS — B009 Herpesviral infection, unspecified: Secondary | ICD-10-CM

## 2021-01-21 DIAGNOSIS — Z3480 Encounter for supervision of other normal pregnancy, unspecified trimester: Secondary | ICD-10-CM

## 2021-01-21 DIAGNOSIS — O98519 Other viral diseases complicating pregnancy, unspecified trimester: Secondary | ICD-10-CM

## 2021-01-21 DIAGNOSIS — Z3A34 34 weeks gestation of pregnancy: Secondary | ICD-10-CM

## 2021-01-21 DIAGNOSIS — R772 Abnormality of alphafetoprotein: Secondary | ICD-10-CM

## 2021-01-21 MED ORDER — ACYCLOVIR 400 MG PO TABS: 400.0000 mg | ORAL_TABLET | Freq: Three times a day (TID) | ORAL | 3 refills | Status: DC

## 2021-01-21 NOTE — Progress Notes (Signed)
LOW-RISK PREGNANCY VISIT Patient name: Sara Foster MRN [id number]  Date of birth: [birth year] Chief Complaint:   Routine Prenatal Visit and Pregnancy Ultrasound  History of Present Illness:   Sara Foster is a 25 y.o. G11P1001 female at [redacted]w[redacted]d with an Estimated Date of Delivery: 03/02/21 being seen today for ongoing management of a low-risk pregnancy.   Today she reports  some contractions/pressure . Contractions: Irritability. Vag. Bleeding: None.  Movement: Present. denies leaking of fluid.  Depression screen [facility] 2/9 08/22/2020 08/28/2018  Decreased Interest 3 0  Down, Depressed, Hopeless 2 0  PHQ - 2 Score 5 0  Altered sleeping 3 -  Tired, decreased energy 3 -  Change in appetite 2 -  Feeling bad or failure about yourself  1 -  Trouble concentrating 3 -  Moving slowly or fidgety/restless 2 -  Suicidal thoughts 0 -  PHQ-9 Score 19 -     GAD 7 : Generalized Anxiety Score 08/22/2020  Nervous, Anxious, on Edge 3  Control/stop worrying 2  Worry too much - different things 3  Trouble relaxing 3  Restless 2  Easily annoyed or irritable 3  Afraid - awful might happen 0  Total GAD 7 Score 16      Review of Systems:   Pertinent items are noted in HPI Denies abnormal vaginal discharge w/ itching/odor/irritation, headaches, visual changes, shortness of breath, chest pain, abdominal pain, severe nausea/vomiting, or problems with urination or bowel movements unless otherwise stated above. Pertinent History Reviewed:  Reviewed past medical,surgical, social, obstetrical and family history.  Reviewed problem list, medications and allergies. Physical Assessment:   Vitals:   01/21/21 1441  BP: 131/80  Pulse: (!) 112  Weight: 177 lb 9.6 oz (80.6 kg)  Body mass index is 31.46 kg/m.        Physical Examination:   General appearance: Well appearing, and in no distress  Mental status: Alert, oriented to person, place, and time  Skin: Warm & dry  Cardiovascular: Normal  heart rate noted  Respiratory: Normal respiratory effort, no distress  Abdomen: Soft, gravid, nontender  Pelvic: Cervical exam performed         Extremities: Edema: None  Fetal Status: Fetal Heart Rate (bpm): 161 u/s   Movement: Present  [name] 34+2 wks,cephalic,anterior placenta gr 3,AFI 15.9 cm,FHR 161 bpm,EFW 3267 g 99%  Chaperone: N/A   No results found for this or any previous visit (from the past 24 hour(s)).  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    Dispense:  90 tablet    Refill:  3    Order Specific Question:   Supervising Provider    Answer:   Lazaro Arms [[id number]]   Labs/procedures today: U/S  Plan:  Continue routine obstetrical care  Next visit: prefers will be in person for cultures     Reviewed: Preterm labor symptoms and general obstetric precautions including but not limited to vaginal bleeding, contractions, leaking of fluid and fetal movement were reviewed in detail  with the patient.  All questions were answered. Does have home bp cuff. Office bp cuff given: not applicable. Check bp weekly, let us know if consistently >140 and/or >90.  Follow-up: Return in about 2 weeks (around 02/04/2021) for LROB, in person, CNM.  Future Appointments  Date Time Provider Department Center  02/03/2021  8:30 AM Cheral Marker, CNM [facility]    No orders of the defined types were placed in this encounter.  Cheral Marker CNM, [facility] 01/21/2021 3:17 PM

## 2021-01-21 NOTE — Patient Instructions (Signed)
Sara Foster, thank you for choosing our office today! We appreciate the opportunity to meet your healthcare needs. You may receive a short survey by mail, e-mail, or through [employer]. If you are happy with your care we would appreciate if you could take just a few minutes to complete the survey questions. We read all of your comments and take your feedback very seriously. Thank you again for choosing our office.  Center for [employer] Team at [facility]  [facility] ([address] [place], [place] [address]) Entrance C, located off of [address] Free 24/7 valet parking   CLASSES: Go to [email or web address] to register for classes (childbirth, breastfeeding, waterbirth, infant CPR, daddy bootcamp, etc.)  Call the office [phone]) or go to [facility] if: You begin to have strong, frequent contractions Your water breaks.  Sometimes it is a big gush of fluid, sometimes it is just a trickle that keeps getting your panties wet or running down your legs You have vaginal bleeding.  It is normal to have a small amount of spotting if your cervix was checked.  You don't feel your baby moving like normal.  If you don't, get you something to eat and drink and lay down and focus on feeling your baby move.   If your baby is still not moving like normal, you should call the office or go to [facility].  Call the office [phone]) or go to [facility] hospital for these signs of pre-eclampsia: Severe headache that does not go away with Tylenol Visual changes- seeing spots, double, blurred vision Pain under your right breast or upper abdomen that does not go away with Tums or heartburn medicine Nausea and/or vomiting Severe swelling in your hands, feet, and face   Tdap Vaccine It is recommended that you get the Tdap vaccine during the third trimester of EACH pregnancy to help protect your baby from getting pertussis (whooping cough) 27-36 weeks is the BEST time to do  this so that you can pass the protection on to your baby. During pregnancy is better than after pregnancy, but if you are unable to get it during pregnancy it will be offered at the hospital.  You can get this vaccine with [name], at the health department, your family doctor, or some local pharmacies Everyone who will be around your baby should also be up-to-date on their vaccines before the baby comes. Adults (who are not pregnant) only need 1 dose of Tdap during adulthood.   [facility] Pediatricians/Family Doctors [facility]): [address] Colette Ribas, [phone]           [facility]: [address], [phone]                [facility]): [address], [phone] (call to ask if accepting patients) [facility] Department: [address], [place], [phone]    [facility] Pediatricians/Family Doctors [facility]): [phone], [phone] [facility]: [address] [place], [phone] [facility]: [address], [phone]  [facility] Doctors  [facility]): [phone] [facility]: [address], [phone]   [facility] Doctors [facility]: [address], [phone]  [facility] Family Doctors  [facility]: [phone], [phone]  Home Blood Pressure Monitoring for Patients   Your provider has recommended that you check your  blood pressure (BP) at least once a week at home. If you do not have a blood pressure cuff at home, one will be provided for you. Contact your provider if you have not received your monitor within 1 week.   Helpful Tips for Accurate Home Blood Pressure Checks  Don't smoke, exercise, or drink caffeine 30 minutes before checking your BP Use the restroom before checking your BP (a full bladder can raise your  pressure) Relax in a comfortable upright chair Feet on the ground Left arm resting comfortably on a flat surface at the level of your heart Legs uncrossed Back supported Sit quietly and don't talk Place the cuff on your bare arm Adjust snuggly, so that only two fingertips can fit between your skin and the top of the cuff Check 2 readings separated by at least one minute Keep a log of your BP readings For a visual, please reference this diagram: http://ccnc.care/bpdiagram  Provider Name: [facility]     Phone: [phone]  Zone 1: ALL CLEAR  Continue to monitor your symptoms:  BP reading is less than 140 (top number) or less than 90 (bottom number)  No right upper stomach pain No headaches or seeing spots No feeling nauseated or throwing up No swelling in face and hands  Zone 2: CAUTION Call your doctor's office for any of the following:  BP reading is greater than 140 (top number) or greater than 90 (bottom number)  Stomach pain under your ribs in the middle or right side Headaches or seeing spots Feeling nauseated or throwing up Swelling in face and hands  Zone 3: EMERGENCY  Seek immediate medical care if you have any of the following:  BP reading is greater than160 (top number) or greater than 110 (bottom number) Severe headaches not improving with Tylenol Serious difficulty catching your breath Any worsening symptoms from Zone 2  Preterm Labor and Birth Information  The normal length of a pregnancy is 39-41 weeks. Preterm labor is when labor starts before 37 completed weeks of pregnancy. What are the risk factors for preterm labor? Preterm labor is more likely to occur in women who: Have certain infections during pregnancy such as a bladder infection, sexually transmitted infection, or infection inside the uterus (chorioamnionitis). Have a shorter-than-normal cervix. Have gone into preterm labor before. Have had surgery on their cervix. Are younger than age 17  or older than age 35. Are African American. Are pregnant with twins or multiple babies (multiple gestation). Take street drugs or smoke while pregnant. Do not gain enough weight while pregnant. Became pregnant shortly after having been pregnant. What are the symptoms of preterm labor? Symptoms of preterm labor include: Cramps similar to those that can happen during a menstrual period. The cramps may happen with diarrhea. Pain in the abdomen or lower back. Regular uterine contractions that may feel like tightening of the abdomen. A feeling of increased pressure in the pelvis. Increased watery or bloody mucus discharge from the vagina. Water breaking (ruptured amniotic sac). Why is it important to recognize signs of preterm labor? It is important to recognize signs of preterm labor because babies who are born prematurely may not be fully developed. This can put them at an increased risk for: Long-term (chronic) heart and lung problems. Difficulty immediately after birth with regulating body systems, including blood sugar, body temperature, heart rate, and breathing rate. Bleeding in the brain. Cerebral palsy. Learning difficulties. Death. These risks are highest for babies who are born before 34 weeks   of pregnancy. How is preterm labor treated? Treatment depends on the length of your pregnancy, your condition, and the health of your baby. It may involve: Having a stitch (suture) placed in your cervix to prevent your cervix from opening too early (cerclage). Taking or being given medicines, such as: Hormone medicines. These may be given early in pregnancy to help support the pregnancy. Medicine to stop contractions. Medicines to help mature the baby's lungs. These may be prescribed if the risk of delivery is high. Medicines to prevent your baby from developing cerebral palsy. If the labor happens before 34 weeks of pregnancy, you may need to stay in the hospital. What should I do if I  think I am in preterm labor? If you think that you are going into preterm labor, call your health care provider right away. How can I prevent preterm labor in future pregnancies? To increase your chance of having a full-term pregnancy: Do not use any tobacco products, such as cigarettes, chewing tobacco, and e-cigarettes. If you need help quitting, ask your health care provider. Do not use street drugs or medicines that have not been prescribed to you during your pregnancy. Talk with your health care provider before taking any herbal supplements, even if you have been taking them regularly. Make sure you gain a healthy amount of weight during your pregnancy. Watch for infection. If you think that you might have an infection, get it checked right away. Make sure to tell your health care provider if you have gone into preterm labor before. This information is not intended to replace advice given to you by your health care provider. Make sure you discuss any questions you have with your health care provider. Document Revised: 06/23/2018 Document Reviewed: 07/23/2015 Elsevier Patient Education  2020 Elsevier Inc.   

## 2021-01-21 NOTE — Progress Notes (Signed)
[name] 34+2 wks,cephalic,anterior placenta gr 3,AFI 15.9 cm,FHR 161 bpm,EFW 3267 g 99%

## 2021-01-30 ENCOUNTER — Ambulatory Visit: Payer: Medicaid Other

## 2021-02-03 ENCOUNTER — Encounter: Payer: Self-pay | Admitting: Women's Health

## 2021-02-03 ENCOUNTER — Other Ambulatory Visit: Payer: Self-pay

## 2021-02-03 ENCOUNTER — Other Ambulatory Visit (HOSPITAL_COMMUNITY)
Admission: RE | Admit: 2021-02-03 | Discharge: 2021-02-03 | Disposition: A | Discharge status: Home / Self Care | Payer: Medicaid Other | Source: Ambulatory Visit | Attending: Women's Health | Admitting: Women's Health

## 2021-02-03 ENCOUNTER — Ambulatory Visit (INDEPENDENT_AMBULATORY_CARE_PROVIDER_SITE_OTHER): Payer: Medicaid Other | Admitting: Women's Health

## 2021-02-03 VITALS — BP 138/81 | HR 96 | Wt 181.0 lb

## 2021-02-03 DIAGNOSIS — Z3A36 36 weeks gestation of pregnancy: Secondary | ICD-10-CM | POA: Diagnosis present

## 2021-02-03 DIAGNOSIS — Z3483 Encounter for supervision of other normal pregnancy, third trimester: Secondary | ICD-10-CM | POA: Diagnosis present

## 2021-02-03 NOTE — Patient Instructions (Signed)
Sara Foster, thank you for choosing our office today! We appreciate the opportunity to meet your healthcare needs. You may receive a short survey by mail, e-mail, or through [employer]. If you are happy with your care we would appreciate if you could take just a few minutes to complete the survey questions. We read all of your comments and take your feedback very seriously. Thank you again for choosing our office.  Center for [employer] Team at [facility]  [facility] ([address] [place], [place] [address]) Entrance C, located off of [address] Free 24/7 valet parking   CLASSES: Go to [email or web address] to register for classes (childbirth, breastfeeding, waterbirth, infant CPR, daddy bootcamp, etc.)  Call the office [phone]) or go to [facility] if: You begin to have strong, frequent contractions Your water breaks.  Sometimes it is a big gush of fluid, sometimes it is just a trickle that keeps getting your panties wet or running down your legs You have vaginal bleeding.  It is normal to have a small amount of spotting if your cervix was checked.  You don't feel your baby moving like normal.  If you don't, get you something to eat and drink and lay down and focus on feeling your baby move.   If your baby is still not moving like normal, you should call the office or go to [facility].  Call the office [phone]) or go to [facility] hospital for these signs of pre-eclampsia: Severe headache that does not go away with Tylenol Visual changes- seeing spots, double, blurred vision Pain under your right breast or upper abdomen that does not go away with Tums or heartburn medicine Nausea and/or vomiting Severe swelling in your hands, feet, and face   Tdap Vaccine It is recommended that you get the Tdap vaccine during the third trimester of EACH pregnancy to help protect your baby from getting pertussis (whooping cough) 27-36 weeks is the BEST time to do  this so that you can pass the protection on to your baby. During pregnancy is better than after pregnancy, but if you are unable to get it during pregnancy it will be offered at the hospital.  You can get this vaccine with [name], at the health department, your family doctor, or some local pharmacies Everyone who will be around your baby should also be up-to-date on their vaccines before the baby comes. Adults (who are not pregnant) only need 1 dose of Tdap during adulthood.   [facility] Pediatricians/Family Doctors [facility]): [address] Colette Ribas, [phone]           [facility]: [address], [phone]                [facility]): [address], [phone] (call to ask if accepting patients) [facility] Department: [address], [place], [phone]    [facility] Pediatricians/Family Doctors [facility]): [phone], [phone] [facility]: [address] [place], [phone] [facility]: [address], [phone]  [facility] Doctors  [facility]): [phone] [facility]: [address], [phone]   [facility] Doctors [facility]: [address], [phone]  [facility] Family Doctors  [facility]: [phone], [phone]  Home Blood Pressure Monitoring for Patients   Your provider has recommended that you check your  blood pressure (BP) at least once a week at home. If you do not have a blood pressure cuff at home, one will be provided for you. Contact your provider if you have not received your monitor within 1 week.   Helpful Tips for Accurate Home Blood Pressure Checks  Don't smoke, exercise, or drink caffeine 30 minutes before checking your BP Use the restroom before checking your BP (a full bladder can raise your  pressure) Relax in a comfortable upright chair Feet on the ground Left arm resting comfortably on a flat surface at the level of your heart Legs uncrossed Back supported Sit quietly and don't talk Place the cuff on your bare arm Adjust snuggly, so that only two fingertips can fit between your skin and the top of the cuff Check 2 readings separated by at least one minute Keep a log of your BP readings For a visual, please reference this diagram: http://ccnc.care/bpdiagram  Provider Name: [facility]     Phone: [phone]  Zone 1: ALL CLEAR  Continue to monitor your symptoms:  BP reading is less than 140 (top number) or less than 90 (bottom number)  No right upper stomach pain No headaches or seeing spots No feeling nauseated or throwing up No swelling in face and hands  Zone 2: CAUTION Call your doctor's office for any of the following:  BP reading is greater than 140 (top number) or greater than 90 (bottom number)  Stomach pain under your ribs in the middle or right side Headaches or seeing spots Feeling nauseated or throwing up Swelling in face and hands  Zone 3: EMERGENCY  Seek immediate medical care if you have any of the following:  BP reading is greater than160 (top number) or greater than 110 (bottom number) Severe headaches not improving with Tylenol Serious difficulty catching your breath Any worsening symptoms from Zone 2  Preterm Labor and Birth Information  The normal length of a pregnancy is 39-41 weeks. Preterm labor is when labor starts before 37 completed weeks of pregnancy. What are the risk factors for preterm labor? Preterm labor is more likely to occur in women who: Have certain infections during pregnancy such as a bladder infection, sexually transmitted infection, or infection inside the uterus (chorioamnionitis). Have a shorter-than-normal cervix. Have gone into preterm labor before. Have had surgery on their cervix. Are younger than age 17  or older than age 35. Are African American. Are pregnant with twins or multiple babies (multiple gestation). Take street drugs or smoke while pregnant. Do not gain enough weight while pregnant. Became pregnant shortly after having been pregnant. What are the symptoms of preterm labor? Symptoms of preterm labor include: Cramps similar to those that can happen during a menstrual period. The cramps may happen with diarrhea. Pain in the abdomen or lower back. Regular uterine contractions that may feel like tightening of the abdomen. A feeling of increased pressure in the pelvis. Increased watery or bloody mucus discharge from the vagina. Water breaking (ruptured amniotic sac). Why is it important to recognize signs of preterm labor? It is important to recognize signs of preterm labor because babies who are born prematurely may not be fully developed. This can put them at an increased risk for: Long-term (chronic) heart and lung problems. Difficulty immediately after birth with regulating body systems, including blood sugar, body temperature, heart rate, and breathing rate. Bleeding in the brain. Cerebral palsy. Learning difficulties. Death. These risks are highest for babies who are born before 34 weeks   of pregnancy. How is preterm labor treated? Treatment depends on the length of your pregnancy, your condition, and the health of your baby. It may involve: Having a stitch (suture) placed in your cervix to prevent your cervix from opening too early (cerclage). Taking or being given medicines, such as: Hormone medicines. These may be given early in pregnancy to help support the pregnancy. Medicine to stop contractions. Medicines to help mature the baby's lungs. These may be prescribed if the risk of delivery is high. Medicines to prevent your baby from developing cerebral palsy. If the labor happens before 34 weeks of pregnancy, you may need to stay in the hospital. What should I do if I  think I am in preterm labor? If you think that you are going into preterm labor, call your health care provider right away. How can I prevent preterm labor in future pregnancies? To increase your chance of having a full-term pregnancy: Do not use any tobacco products, such as cigarettes, chewing tobacco, and e-cigarettes. If you need help quitting, ask your health care provider. Do not use street drugs or medicines that have not been prescribed to you during your pregnancy. Talk with your health care provider before taking any herbal supplements, even if you have been taking them regularly. Make sure you gain a healthy amount of weight during your pregnancy. Watch for infection. If you think that you might have an infection, get it checked right away. Make sure to tell your health care provider if you have gone into preterm labor before. This information is not intended to replace advice given to you by your health care provider. Make sure you discuss any questions you have with your health care provider. Document Revised: 06/23/2018 Document Reviewed: 07/23/2015 Elsevier Patient Education  2020 Elsevier Inc.   

## 2021-02-03 NOTE — Progress Notes (Signed)
LOW-RISK PREGNANCY VISIT Patient name: Sara Foster MRN [id number]  Date of birth: Sep 04, 1995 Chief Complaint:   Routine Prenatal Visit  History of Present Illness:   Sara Foster is a 25 y.o. G47P1001 female at [redacted]w[redacted]d with an Estimated Date of Delivery: 03/02/21 being seen today for ongoing management of a low-risk pregnancy.   Today she reports  normal aches/pains . Denies ha, visual changes, ruq/epigastric pain, n/v.   Contractions: Irritability. Vag. Bleeding: None.  Movement: Present. denies leaking of fluid.  Depression screen [facility] 2/9 08/22/2020 08/28/2018  Decreased Interest 3 0  Down, Depressed, Hopeless 2 0  PHQ - 2 Score 5 0  Altered sleeping 3 -  Tired, decreased energy 3 -  Change in appetite 2 -  Feeling bad or failure about yourself  1 -  Trouble concentrating 3 -  Moving slowly or fidgety/restless 2 -  Suicidal thoughts 0 -  PHQ-9 Score 19 -     GAD 7 : Generalized Anxiety Score 08/22/2020  Nervous, Anxious, on Edge 3  Control/stop worrying 2  Worry too much - different things 3  Trouble relaxing 3  Restless 2  Easily annoyed or irritable 3  Afraid - awful might happen 0  Total GAD 7 Score 16      Review of Systems:   Pertinent items are noted in HPI Denies abnormal vaginal discharge w/ itching/odor/irritation, headaches, visual changes, shortness of breath, chest pain, abdominal pain, severe nausea/vomiting, or problems with urination or bowel movements unless otherwise stated above. Pertinent History Reviewed:  Reviewed past medical,surgical, social, obstetrical and family history.  Reviewed problem list, medications and allergies. Physical Assessment:   Vitals:   02/03/21 0824  BP: 138/81  Pulse: 96  Weight: 181 lb (82.1 kg)  Body mass index is 32.06 kg/m.        Physical Examination:   General appearance: Well appearing, and in no distress  Mental status: Alert, oriented to person, place, and time  Skin: Warm & dry  Cardiovascular:  Normal heart rate noted  Respiratory: Normal respiratory effort, no distress  Abdomen: Soft, gravid, nontender  Pelvic: Cervical exam performed  Dilation: 3.5 Effacement (%): 50 Station: -2  Extremities: Edema: None  Fetal Status: Fetal Heart Rate (bpm): 158 Fundal Height: 36 cm Movement: Present Presentation: Vertex  Chaperone: Celene Squibb   No results found for this or any previous visit (from the past 24 hour(s)).  Assessment & Plan:  1) Low-risk pregnancy G2P1001 at [redacted]w[redacted]d with an Estimated Date of Delivery: 03/02/21   2) Borderline bp, no proteinuria, asymptomatic. Reviewed pre-e s/s, reasons to seek care. Check bp at least daily at home, let us know if >140/90  3) Suspected LGA> 99% @ 34.2wks, will repeat in 2wks. GTT normal   Meds: No orders of the defined types were placed in this encounter.  Labs/procedures today: GC/CT and SVE (GBS+urine earlier in pregnancy)  Plan:  Continue routine obstetrical care  Next visit: prefers in person    Reviewed: Preterm labor symptoms and general obstetric precautions including but not limited to vaginal bleeding, contractions, leaking of fluid and fetal movement were reviewed in detail with the patient.  All questions were answered. Does have home bp cuff. Office bp cuff given: not applicable. Check bp daily, let us know if consistently >140 and/or >90.  Follow-up: Return for weekly, CNM, in person, LROB (EFW in 2wks please).  No future appointments.  No orders of the defined types were placed in this encounter.  Cheral Marker CNM, [facility] 02/03/2021 9:03 AM

## 2021-02-04 ENCOUNTER — Other Ambulatory Visit: Payer: Self-pay | Admitting: Advanced Practice Midwife

## 2021-02-04 LAB — CERVICOVAGINAL ANCILLARY ONLY
Chlamydia: NEGATIVE
Comment: NEGATIVE
Comment: NORMAL
Neisseria Gonorrhea: NEGATIVE

## 2021-02-04 LAB — OB RESULTS CONSOLE GC/CHLAMYDIA: Gonorrhea: NEGATIVE

## 2021-02-10 ENCOUNTER — Encounter: Payer: Self-pay | Admitting: *Deleted

## 2021-02-13 ENCOUNTER — Other Ambulatory Visit: Payer: Self-pay

## 2021-02-13 ENCOUNTER — Ambulatory Visit (INDEPENDENT_AMBULATORY_CARE_PROVIDER_SITE_OTHER): Payer: Medicaid Other | Admitting: Obstetrics & Gynecology

## 2021-02-13 ENCOUNTER — Encounter: Payer: Self-pay | Admitting: Obstetrics & Gynecology

## 2021-02-13 VITALS — BP 123/79 | HR 97 | Wt 179.8 lb

## 2021-02-13 DIAGNOSIS — Z3483 Encounter for supervision of other normal pregnancy, third trimester: Secondary | ICD-10-CM

## 2021-02-13 NOTE — Progress Notes (Signed)
   LOW-RISK PREGNANCY VISIT Patient name: Sara Foster MRN [id number]  Date of birth: 1995-05-30 Chief Complaint:   Routine Prenatal Visit  History of Present Illness:   Sara Foster is a 25 y.o. G82P1001 female at [redacted]w[redacted]d with an Estimated Date of Delivery: 03/02/21 being seen today for ongoing management of a low-risk pregnancy.   Depression screen Sara Foster 2/9 08/22/2020 08/28/2018  Decreased Interest 3 0  Down, Depressed, Hopeless 2 0  PHQ - 2 Score 5 0  Altered sleeping 3 -  Tired, decreased energy 3 -  Change in appetite 2 -  Feeling bad or failure about yourself  1 -  Trouble concentrating 3 -  Moving slowly or fidgety/restless 2 -  Suicidal thoughts 0 -  PHQ-9 Score 19 -    Today she reports no complaints- just pelvic pressure.  Contractions: Not present. Vag. Bleeding: None.  Movement: Present. denies leaking of fluid. Review of Systems:   Pertinent items are noted in HPI Denies abnormal vaginal discharge w/ itching/odor/irritation, headaches, visual changes, shortness of breath, chest pain, abdominal pain, severe nausea/vomiting, or problems with urination or bowel movements unless otherwise stated above. Pertinent History Reviewed:  Reviewed past medical,surgical, social, obstetrical and family history.  Reviewed problem list, medications and allergies.  Physical Assessment:   Vitals:   02/13/21 0853  BP: 123/79  Pulse: 97  Weight: 179 lb 12.8 oz (81.6 kg)  Body mass index is 31.85 kg/m.        Physical Examination:   General appearance: Well appearing, and in no distress  Mental status: Alert, oriented to person, place, and time  Skin: Warm & dry  Respiratory: Normal respiratory effort, no distress  Abdomen: Soft, gravid, nontender  Pelvic: Cervical exam performed  Dilation: 4 Effacement (%): 60 Station: -2  Extremities: Edema: None  Psych:  mood and affect appropriate  Fetal Status: Fetal Heart Rate (bpm): 150 Fundal Height: 37 cm Movement: Present  Presentation: Vertex  Chaperone:  Angel Neas     No results found for this or any previous visit (from the past 24 hour(s)).   Assessment & Plan:  1) Low-risk pregnancy G2P1001 at [redacted]w[redacted]d with an Estimated Date of Delivery: 03/02/21   -GBS pos- PCN in labor   Meds: No orders of the defined types were placed in this encounter.  Labs/procedures today: doppler  Plan:  Continue routine obstetrical care  Next visit: prefers in person    Reviewed: Preterm labor symptoms and general obstetric precautions including but not limited to vaginal bleeding, contractions, leaking of fluid and fetal movement were reviewed in detail with the patient.  All questions were answered. Pt has home bp cuff. Check bp weekly, let Sara Foster know if >140/90.   Follow-up: Return in about 1 week (around 02/20/2021) for LROB visit.  No orders of the defined types were placed in this encounter.   Sara Hidalgo, DO Attending Obstetrician & Gynecologist, [facility] for [employer], [facility]

## 2021-02-16 ENCOUNTER — Other Ambulatory Visit: Payer: Self-pay | Admitting: Obstetrics & Gynecology

## 2021-02-16 DIAGNOSIS — O280 Abnormal hematological finding on antenatal screening of mother: Secondary | ICD-10-CM

## 2021-02-17 ENCOUNTER — Encounter (HOSPITAL_COMMUNITY): Payer: Self-pay | Admitting: Obstetrics and Gynecology

## 2021-02-17 ENCOUNTER — Inpatient Hospital Stay (HOSPITAL_COMMUNITY)
Admission: AD | Admit: 2021-02-17 | Discharge: 2021-02-19 | DRG: 806 | Disposition: A | Discharge status: Home / Self Care | Payer: Medicaid Other | Attending: Family Medicine | Admitting: Family Medicine

## 2021-02-17 ENCOUNTER — Other Ambulatory Visit: Payer: Self-pay

## 2021-02-17 ENCOUNTER — Ambulatory Visit (INDEPENDENT_AMBULATORY_CARE_PROVIDER_SITE_OTHER): Payer: Medicaid Other

## 2021-02-17 ENCOUNTER — Ambulatory Visit (INDEPENDENT_AMBULATORY_CARE_PROVIDER_SITE_OTHER): Payer: Medicaid Other | Admitting: Obstetrics & Gynecology

## 2021-02-17 VITALS — BP 131/75 | HR 101 | Wt 180.0 lb

## 2021-02-17 DIAGNOSIS — Z3483 Encounter for supervision of other normal pregnancy, third trimester: Secondary | ICD-10-CM

## 2021-02-17 DIAGNOSIS — Z3A38 38 weeks gestation of pregnancy: Secondary | ICD-10-CM

## 2021-02-17 DIAGNOSIS — O1092 Unspecified pre-existing hypertension complicating childbirth: Secondary | ICD-10-CM | POA: Diagnosis not present

## 2021-02-17 DIAGNOSIS — O99824 Streptococcus B carrier state complicating childbirth: Secondary | ICD-10-CM | POA: Diagnosis present

## 2021-02-17 DIAGNOSIS — Z3480 Encounter for supervision of other normal pregnancy, unspecified trimester: Secondary | ICD-10-CM

## 2021-02-17 DIAGNOSIS — R8271 Bacteriuria: Secondary | ICD-10-CM | POA: Diagnosis present

## 2021-02-17 DIAGNOSIS — O280 Abnormal hematological finding on antenatal screening of mother: Secondary | ICD-10-CM

## 2021-02-17 DIAGNOSIS — O162 Unspecified maternal hypertension, second trimester: Secondary | ICD-10-CM

## 2021-02-17 DIAGNOSIS — A6000 Herpesviral infection of urogenital system, unspecified: Secondary | ICD-10-CM | POA: Diagnosis present

## 2021-02-17 DIAGNOSIS — O285 Abnormal chromosomal and genetic finding on antenatal screening of mother: Secondary | ICD-10-CM | POA: Diagnosis present

## 2021-02-17 DIAGNOSIS — F418 Other specified anxiety disorders: Secondary | ICD-10-CM | POA: Diagnosis present

## 2021-02-17 DIAGNOSIS — O1002 Pre-existing essential hypertension complicating childbirth: Secondary | ICD-10-CM | POA: Diagnosis present

## 2021-02-17 DIAGNOSIS — O9832 Other infections with a predominantly sexual mode of transmission complicating childbirth: Secondary | ICD-10-CM | POA: Diagnosis present

## 2021-02-17 DIAGNOSIS — Z20822 Contact with and (suspected) exposure to covid-19: Secondary | ICD-10-CM | POA: Diagnosis present

## 2021-02-17 DIAGNOSIS — O09899 Supervision of other high risk pregnancies, unspecified trimester: Secondary | ICD-10-CM

## 2021-02-17 DIAGNOSIS — O99344 Other mental disorders complicating childbirth: Secondary | ICD-10-CM | POA: Diagnosis present

## 2021-02-17 DIAGNOSIS — Z2839 Other underimmunization status: Secondary | ICD-10-CM

## 2021-02-17 DIAGNOSIS — O98519 Other viral diseases complicating pregnancy, unspecified trimester: Secondary | ICD-10-CM | POA: Diagnosis present

## 2021-02-17 DIAGNOSIS — B009 Herpesviral infection, unspecified: Secondary | ICD-10-CM | POA: Diagnosis present

## 2021-02-17 DIAGNOSIS — O9852 Other viral diseases complicating childbirth: Secondary | ICD-10-CM | POA: Diagnosis not present

## 2021-02-17 DIAGNOSIS — O26893 Other specified pregnancy related conditions, third trimester: Secondary | ICD-10-CM | POA: Diagnosis present

## 2021-02-17 DIAGNOSIS — Z23 Encounter for immunization: Secondary | ICD-10-CM

## 2021-02-17 LAB — CBC
HCT: 35.8 % — ABNORMAL LOW (ref 36.0–46.0)
Hemoglobin: 12 g/dL (ref 12.0–15.0)
MCH: 29.9 pg (ref 26.0–34.0)
MCHC: 33.5 g/dL (ref 30.0–36.0)
MCV: 89.3 fL (ref 80.0–100.0)
Platelets: 302 10*3/uL (ref 150–400)
RBC: 4.01 MIL/uL (ref 3.87–5.11)
RDW: 14 % (ref 11.5–15.5)
WBC: 17.1 10*3/uL — ABNORMAL HIGH (ref 4.0–10.5)
nRBC: 0 % (ref 0.0–0.2)

## 2021-02-17 LAB — RESP PANEL BY RT-PCR (FLU A&B, COVID) ARPGX2
Influenza A by PCR: NEGATIVE
Influenza B by PCR: NEGATIVE
SARS Coronavirus 2 by RT PCR: NEGATIVE

## 2021-02-17 MED ORDER — OXYCODONE-ACETAMINOPHEN 5-325 MG PO TABS: 2.0000 | ORAL_TABLET | ORAL | Status: DC | PRN

## 2021-02-17 MED ORDER — PENICILLIN G POT IN DEXTROSE 60000 UNIT/ML IV SOLN
3.0000 10*6.[IU] | INTRAVENOUS | Status: DC
  Administered 2021-02-18 (×2): 3 10*6.[IU] via INTRAVENOUS
  Filled 2021-02-17 (×2): qty 50

## 2021-02-17 MED ORDER — FLEET ENEMA 7-19 GM/118ML RE ENEM: 1.0000 | ENEMA | RECTAL | Status: DC | PRN

## 2021-02-17 MED ORDER — EPHEDRINE 5 MG/ML INJ
10.0000 mg | INTRAVENOUS | Status: DC | PRN
  Administered 2021-02-18: 10 mg via INTRAVENOUS

## 2021-02-17 MED ORDER — OXYTOCIN BOLUS FROM INFUSION
333.0000 mL | Freq: Once | INTRAVENOUS | Status: AC
  Administered 2021-02-18: 333 mL via INTRAVENOUS

## 2021-02-17 MED ORDER — ACETAMINOPHEN 325 MG PO TABS
650.0000 mg | ORAL_TABLET | ORAL | Status: DC | PRN
  Administered 2021-02-18: 650 mg via ORAL
  Filled 2021-02-17: qty 2

## 2021-02-17 MED ORDER — DIPHENHYDRAMINE HCL 50 MG/ML IJ SOLN: 12.5000 mg | INTRAMUSCULAR | Status: DC | PRN

## 2021-02-17 MED ORDER — OXYTOCIN-SODIUM CHLORIDE 30-0.9 UT/500ML-% IV SOLN
2.5000 [IU]/h | INTRAVENOUS | Status: DC
  Filled 2021-02-17: qty 500

## 2021-02-17 MED ORDER — SODIUM CHLORIDE 0.9 % IV SOLN: 5.0000 10*6.[IU] | Freq: Once | INTRAVENOUS | Status: DC

## 2021-02-17 MED ORDER — PENICILLIN G POT IN DEXTROSE 60000 UNIT/ML IV SOLN: 3.0000 10*6.[IU] | INTRAVENOUS | Status: DC

## 2021-02-17 MED ORDER — OXYCODONE-ACETAMINOPHEN 5-325 MG PO TABS: 1.0000 | ORAL_TABLET | ORAL | Status: DC | PRN

## 2021-02-17 MED ORDER — EPHEDRINE 5 MG/ML INJ
10.0000 mg | INTRAVENOUS | Status: DC | PRN
  Filled 2021-02-17: qty 5

## 2021-02-17 MED ORDER — LACTATED RINGERS IV SOLN: INTRAVENOUS | Status: DC

## 2021-02-17 MED ORDER — PHENYLEPHRINE 40 MCG/ML (10ML) SYRINGE FOR IV PUSH (FOR BLOOD PRESSURE SUPPORT)
80.0000 ug | PREFILLED_SYRINGE | INTRAVENOUS | Status: DC | PRN
  Administered 2021-02-18 (×2): 80 ug via INTRAVENOUS
  Filled 2021-02-17: qty 10

## 2021-02-17 MED ORDER — SOD CITRATE-CITRIC ACID 500-334 MG/5ML PO SOLN: 30.0000 mL | ORAL | Status: DC | PRN

## 2021-02-17 MED ORDER — LACTATED RINGERS IV SOLN: 500.0000 mL | INTRAVENOUS | Status: DC | PRN

## 2021-02-17 MED ORDER — FENTANYL-BUPIVACAINE-NACL 0.5-0.125-0.9 MG/250ML-% EP SOLN
12.0000 mL/h | EPIDURAL | Status: DC | PRN
  Administered 2021-02-18: 12 mL/h via EPIDURAL
  Filled 2021-02-17: qty 250

## 2021-02-17 MED ORDER — LACTATED RINGERS IV SOLN: 500.0000 mL | Freq: Once | INTRAVENOUS | Status: DC

## 2021-02-17 MED ORDER — LIDOCAINE HCL (PF) 1 % IJ SOLN: 30.0000 mL | INTRAMUSCULAR | Status: DC | PRN

## 2021-02-17 MED ORDER — ONDANSETRON HCL 4 MG/2ML IJ SOLN: 4.0000 mg | Freq: Four times a day (QID) | INTRAMUSCULAR | Status: DC | PRN

## 2021-02-17 MED ORDER — SODIUM CHLORIDE 0.9 % IV SOLN
5.0000 10*6.[IU] | Freq: Once | INTRAVENOUS | Status: AC
  Administered 2021-02-17: 5 10*6.[IU] via INTRAVENOUS
  Filled 2021-02-17: qty 5

## 2021-02-17 MED ORDER — PHENYLEPHRINE 40 MCG/ML (10ML) SYRINGE FOR IV PUSH (FOR BLOOD PRESSURE SUPPORT): 80.0000 ug | PREFILLED_SYRINGE | INTRAVENOUS | Status: DC | PRN

## 2021-02-17 NOTE — MAU Note (Addendum)
Contractions since she got her membranes stripped at the office earlier today, now they are every 5 minutes. Denies leaking of fluid. +FM

## 2021-02-17 NOTE — Progress Notes (Signed)
[name] 38+1 wks,cephalic,fhr 129 bpm,anterior placenta gr 3,AFI 13 cm,EFW 3837 g 91%

## 2021-02-17 NOTE — Progress Notes (Signed)
   LOW-RISK PREGNANCY VISIT Patient name: Sara Foster MRN [id number]  Date of birth: [birth year] Chief Complaint:   Routine Prenatal Visit and Pregnancy Ultrasound  History of Present Illness:   Sara Foster is a 25 y.o. G20P1001 female at [redacted]w[redacted]d with an Estimated Date of Delivery: 03/02/21 being seen today for ongoing management of a low-risk pregnancy.  Depression screen [facility] 2/9 08/22/2020 08/28/2018  Decreased Interest 3 0  Down, Depressed, Hopeless 2 0  PHQ - 2 Score 5 0  Altered sleeping 3 -  Tired, decreased energy 3 -  Change in appetite 2 -  Feeling bad or failure about yourself  1 -  Trouble concentrating 3 -  Moving slowly or fidgety/restless 2 -  Suicidal thoughts 0 -  PHQ-9 Score 19 -    Today she reports no complaints. Contractions: Irritability. Vag. Bleeding: None.  Movement: Present. denies leaking of fluid. Review of Systems:   Pertinent items are noted in HPI Denies abnormal vaginal discharge w/ itching/odor/irritation, headaches, visual changes, shortness of breath, chest pain, abdominal pain, severe nausea/vomiting, or problems with urination or bowel movements unless otherwise stated above. Pertinent History Reviewed:  Reviewed past medical,surgical, social, obstetrical and family history.  Reviewed problem list, medications and allergies. Physical Assessment:   Vitals:   02/17/21 1622  BP: 131/75  Pulse: (!) 101  Weight: 180 lb (81.6 kg)  Body mass index is 31.89 kg/m.        Physical Examination:   General appearance: Well appearing, and in no distress  Mental status: Alert, oriented to person, place, and time  Skin: Warm & dry  Cardiovascular: Normal heart rate noted  Respiratory: Normal respiratory effort, no distress  Abdomen: Soft, gravid, nontender  Pelvic: Cervical exam performed  Dilation: 4.5 Effacement (%): 70 Station: -2  Extremities: Edema: None  Fetal Status: Fetal Heart Rate (bpm): 144 Fundal Height: 39 cm Movement: Present  Presentation: Vertex  Chaperone: Malachy Mood    No results found for this or any previous visit (from the past 24 hour(s)).  Assessment & Plan:  1) Low-risk pregnancy G2P1001 at [redacted]w[redacted]d with an Estimated Date of Delivery: 03/02/21   2) EFW 91%,    Meds: No orders of the defined types were placed in this encounter.  Labs/procedures today: sonogram  Plan:  Continue routine obstetrical care  Next visit: prefers in person    Reviewed: Preterm labor symptoms and general obstetric precautions including but not limited to vaginal bleeding, contractions, leaking of fluid and fetal movement were reviewed in detail with the patient.  All questions were answered.  home bp cuff. Rx faxed to . Check bp weekly, let us know if >140/90.   Follow-up: Return in about 1 week (around 02/24/2021) for LROB.  No orders of the defined types were placed in this encounter.   Lazaro Arms, MD 02/17/2021 4:50 PM

## 2021-02-17 NOTE — H&P (Addendum)
OBSTETRIC ADMISSION HISTORY AND PHYSICAL  Sara Foster is a 25 y.o. female G2P1001 with IUP at 52w1dby LMP and c/w 8w [place] for active labor. She reports +FMs, No LOF, no VB, no blurry vision, headaches or peripheral edema, and RUQ pain.  She plans on bottle feeding. She request Depo for birth control peripartum, followed by IUD placement in the office later. [facility]  Dating: By LMP c/w 8w [place]--->  Estimated Date of Delivery: 03/02/21  Sono:    _0 , CWD, normal anatomy, cephalic presentation, 38206O 99% EFW   Prenatal History/Complications:  Preterm cervical dilation at 334w5ds/p BMZx2 and Procardia prn. Elevated AFP HSV Depression with Anxiety Elevated BP in pregnancy  Past Medical History: Past Medical History:  Diagnosis Date   Chlamydia 2015   Encounter for education about contraceptive use 03/24/2015   Generalized anxiety disorder    Gonorrhea 2015   Low back pain    Migraines    OAB (overactive bladder) 05/06/2014    Past Surgical History: Past Surgical History:  Procedure Laterality Date   WISDOM TOOTH EXTRACTION      Obstetrical History: OB History     Gravida  2   Para  1   Term  1   Preterm      AB      Living  1      SAB      IAB      Ectopic      Multiple      Live Births  1           Social History Social History   Socioeconomic History   Marital status: Single    Spouse name: Not on file   Number of children: 1   Years of education: 12   Highest education level: Not on file  Occupational History   Not on file  Tobacco Use   Smoking status: Never   Smokeless tobacco: Never  Vaping Use   Vaping Use: Never used  Substance and Sexual Activity   Alcohol use: Not Currently    Comment: very rarely   Drug use: No   Sexual activity: Yes    Birth control/protection: None  Other Topics Concern   Not on file  Social History Narrative   Lives with boyfriend and  daughter   Caffeine use: 1 cup tea per day   Coffee occass   Social Determinants of Health   Financial Resource Strain: Low Risk    Difficulty of Paying Living Expenses: Not very hard  Food Insecurity: No Food Insecurity   Worried About RuCharity fundraisern the Last Year: Never true   Ran Out of Food in the Last Year: Never true  Transportation Needs: No Transportation Needs   Lack of Transportation (Medical): No   Lack of Transportation (Non-Medical): No  Physical Activity: Insufficiently Active   Days of Exercise per Week: 1 day   Minutes of Exercise per Session: 30 min  Stress: Stress Concern Present   Feeling of Stress : Rather much  Social Connections: Socially Isolated   Frequency of Communication with Friends and Family: More than three times a week   Frequency of Social Gatherings with Friends and Family: Twice a week   Attends Religious Services: Never   AcMarine scientistr Organizations: No   Attends ClArchivisteetings: Never   Marital Status: Never married    Family History: Family  History  Problem Relation Age of Onset   Asthma Mother    COPD Father    Lung disease Father    Asthma Daughter    Breast cancer Maternal Aunt    Arthritis Maternal Grandmother    COPD Maternal Grandfather    Lung disease Maternal Grandfather    Heart disease Paternal Grandfather    Cancer Other        maternal great grandma   Migraines Other     Allergies: No Known Allergies  Medications Prior to Admission  Medication Sig Dispense Refill Last Dose   acyclovir (ZOVIRAX) 400 MG tablet Take 1 tablet (400 mg total) by mouth 3 (three) times daily. 90 tablet 3 02/16/2021   DULoxetine (CYMBALTA) 60 MG capsule Take 1 capsule (60 mg total) by mouth daily. 90 capsule 3 02/16/2021   montelukast (SINGULAIR) 10 MG tablet Take 10 mg by mouth daily.   02/16/2021   Prenatal Vit-Fe Fumarate-FA (MULTIVITAMIN-PRENATAL) 27-0.8 MG TABS tablet Take 1 tablet by mouth daily at 12  noon.   02/16/2021   Blood Pressure Monitoring (BLOOD PRESSURE KIT) DEVI 1 Units by Does not apply route once a week. 1 each 0    NIFEdipine (PROCARDIA) 10 MG capsule Take 1 capsule (10 mg total) by mouth every 4 (four) hours as needed. (Patient not taking: Reported on 02/03/2021) 20 capsule 0    promethazine (PHENERGAN) 25 MG tablet TAKE 1 TABLET BY MOUTH EVERY 6 HOURS AS NEEDED FOR NAUSEA AND VOMITING 30 tablet 1 More than a month     Review of Systems   All systems reviewed and negative except as stated in HPI  Blood pressure 121/76, pulse (!) 136, temperature 97.9 F (36.6 C), temperature source Oral, last menstrual period 05/26/2020, SpO2 99 %. General appearance: alert and cooperative Lungs: clear to auscultation bilaterally Heart: regular rate and rhythm Abdomen: soft, non-tender; bowel sounds normal Pelvic: No active lesions.  Extremities: Homans sign is negative, no sign of DVT Presentation: cephalic Fetal monitoring: Moderate variability. Appropriate BHR. +Accels. Uterine activity: Present Dilation: 5 Effacement (%): 70 Station: -2 Exam by:: Ardelle Lesches, RN   Prenatal labs: ABO, Rh: A/Positive/-- (06/10 1023) Antibody: Negative (10/13 0816) Rubella: <0.90 (06/10 1023) RPR: Non Reactive (10/13 0816)  HBsAg: Negative (06/10 1023)  HIV: Non Reactive (10/13 0816)  GBS:      FAMILY TREE  RESULTS  Language English Pap 08/28/18 neg  Initiated care at 12wk GC/CT Initial:   -/-         36wks:  -/-  Dating by LMP 8wk U/S    Support person  Genetics NT/IT: increased r/f OSB    AFP:      Panorama: low risk   BP cuff Has bp cuff Carrier Screen     Wrens/Hgb Elec   Rhogam n/a    TDaP vaccine 12/05/20 Blood Type A/Positive/-- (06/10 1023)  Flu vaccine 12/23/20  Antibody Negative (06/10 1023)  Covid vaccine 2022 HBsAg Negative (06/10 1023)    RPR Non Reactive (06/10 1023)  Anatomy US Normal female 'Winston' Rubella  <0.90 (06/10 1023)  Feeding Plan bottle HIV Non Reactive  (06/10 1023)  Contraception Probably IUD Hep C neg  [facility]ision Yes, at [facility]    Pediatrician [facility] A1C/GTT Early:      26-28wks: normal  Prenatal Classes discussed      GBS   POS (urine)  _0  PCN allergy  BTL Consent     VBAC Consent  PHQ9 & GAD7  _1 New  OB  _0 28wks   _1 36wks  Waterbirth _2 Class _3  36wkCNM visit/consent      Prenatal Transfer Tool  Maternal Diabetes: No Genetic Screening: LR Panorama. Negative Horizon. Elevated MSAFP with integrated screen 1:160. Maternal Ultrasounds/Referrals: Normal Fetal Ultrasounds or other Referrals:  Referred to Materal Fetal Medicine  Maternal Substance Abuse:  No Significant Maternal Medications:  None Significant Maternal Lab Results: Group B Strep positive  Results for orders placed or performed during the hospital encounter of 02/17/21 (from the past 24 hour(s))  CBC   Collection Time: 02/17/21  9:20 PM  Result Value Ref Range   WBC 17.1 (H) 4.0 - 10.5 K/uL   RBC 4.01 3.87 - 5.11 MIL/uL   Hemoglobin 12.0 12.0 - 15.0 g/dL   HCT 35.8 (L) 36.0 - 46.0 %   MCV 89.3 80.0 - 100.0 fL   MCH 29.9 26.0 - 34.0 pg   MCHC 33.5 30.0 - 36.0 g/dL   RDW 14.0 11.5 - 15.5 %   Platelets 302 150 - 400 K/uL   nRBC 0.0 0.0 - 0.2 %  Resp Panel by RT-PCR (Flu A&B, Covid) Nasopharyngeal Swab   Collection Time: 02/17/21  9:25 PM   Specimen: Nasopharyngeal Swab; Nasopharyngeal(NP) swabs in vial transport medium  Result Value Ref Range   SARS Coronavirus 2 by RT PCR NEGATIVE NEGATIVE   Influenza A by PCR NEGATIVE NEGATIVE   Influenza B by PCR NEGATIVE NEGATIVE    Patient Active Problem List   Diagnosis Date Noted   Normal labor and delivery 02/17/2021   Elevated blood pressure affecting pregnancy in second trimester, antepartum 11/11/2020   Abnormal chromosomal and genetic finding on antenatal screening mother 09/16/2020   GBS bacteriuria 08/27/2020   Encounter for supervision of normal pregnancy, antepartum 08/22/2020   Depression with anxiety  08/22/2020   Accessory breast in female, bilateral 12/14/2013   HSV-2 infection complicating pregnancy 11/57/2620   Rubella non-immune status, antepartum 05/03/2013   Scoliosis 12/31/2010    Assessment/Plan:  Sara Foster is a 25 y.o. G2P1001 at 23w1dhere for spontaneous active labor.  #Labor: Expectant management #Pain: Epidural in place. #FWB: Cat 1 #ID:  GBS pos bacteriuria. Penicillin in labor. #MOF: Breast #MOC: Depo peripartum. IUD in office. #Circ:  Yes #Elevated BP in pregnancy: Has been <140/90, will track and can give Labetalol. Had mildly elevated P:Cr ratio at 0.16 on 8/27. #HSV: Began Acyclovir 4059mTID on 11/9. No active lesions at this time. #Depression with Anxiety: Duloxetine 6048maily  [employer] Pepperedical Student  02/17/2021, 11:41 PM  Patient was seen and examined by me also   S:  G2P1001 with IUP at 38w3w1dLMP and c/w 8w US pKoreasenting for active labor. She reports +FMs, No LOF, no VB, no blurry vision, headaches or peripheral edema, and RUQ pain.  She plans on bottle feeding. She request Depo for birth control peripartum, followed by IUD placement in the office later. [facility] By LMP c/w 8w US -[name]>  Estimated Date of Delivery: 03/02/21 Family, social, medical, surgical and other histories as listed above  O:  AVSS       Cervix 5/70/-2/vertex       Alert and oriented        HR RRR        Resp unlabored        Abdomen gravid, nontender        EFM:  reactive FHR with  regular contractions A;   SIngle IUP at [redacted]w[redacted]d       Active labor  P:   Admit to Labor and Delivery       Routine orders        Epidural         Anticipate SVD  WSeabron Spates CNM

## 2021-02-18 ENCOUNTER — Encounter (HOSPITAL_COMMUNITY): Payer: Self-pay | Admitting: Obstetrics and Gynecology

## 2021-02-18 ENCOUNTER — Inpatient Hospital Stay (HOSPITAL_COMMUNITY): Payer: Medicaid Other | Admitting: Anesthesiology

## 2021-02-18 DIAGNOSIS — O1092 Unspecified pre-existing hypertension complicating childbirth: Secondary | ICD-10-CM

## 2021-02-18 DIAGNOSIS — O99824 Streptococcus B carrier state complicating childbirth: Secondary | ICD-10-CM

## 2021-02-18 DIAGNOSIS — O9852 Other viral diseases complicating childbirth: Secondary | ICD-10-CM

## 2021-02-18 DIAGNOSIS — Z3A38 38 weeks gestation of pregnancy: Secondary | ICD-10-CM

## 2021-02-18 LAB — TYPE AND SCREEN
ABO/RH(D): A POS
Antibody Screen: NEGATIVE

## 2021-02-18 LAB — RPR: RPR Ser Ql: NONREACTIVE

## 2021-02-18 LAB — ABO/RH: ABO/RH(D): A POS

## 2021-02-18 MED ORDER — SENNOSIDES-DOCUSATE SODIUM 8.6-50 MG PO TABS
2.0000 | ORAL_TABLET | ORAL | Status: DC
  Administered 2021-02-18: 2 via ORAL
  Filled 2021-02-18: qty 2

## 2021-02-18 MED ORDER — ONDANSETRON HCL 4 MG PO TABS: 4.0000 mg | ORAL_TABLET | ORAL | Status: DC | PRN

## 2021-02-18 MED ORDER — DIPHENHYDRAMINE HCL 25 MG PO CAPS: 25.0000 mg | ORAL_CAPSULE | Freq: Four times a day (QID) | ORAL | Status: DC | PRN

## 2021-02-18 MED ORDER — DIPHENHYDRAMINE HCL 50 MG/ML IJ SOLN: 12.5000 mg | INTRAMUSCULAR | Status: DC | PRN

## 2021-02-18 MED ORDER — ONDANSETRON HCL 4 MG/2ML IJ SOLN: 4.0000 mg | INTRAMUSCULAR | Status: DC | PRN

## 2021-02-18 MED ORDER — FENTANYL-BUPIVACAINE-NACL 0.5-0.125-0.9 MG/250ML-% EP SOLN: 12.0000 mL/h | EPIDURAL | Status: DC | PRN

## 2021-02-18 MED ORDER — ACETAMINOPHEN 325 MG PO TABS
650.0000 mg | ORAL_TABLET | ORAL | Status: DC | PRN
  Administered 2021-02-18 – 2021-02-19 (×2): 650 mg via ORAL
  Filled 2021-02-18 (×2): qty 2

## 2021-02-18 MED ORDER — ZOLPIDEM TARTRATE 5 MG PO TABS: 5.0000 mg | ORAL_TABLET | Freq: Every evening | ORAL | Status: DC | PRN

## 2021-02-18 MED ORDER — SIMETHICONE 80 MG PO CHEW: 80.0000 mg | CHEWABLE_TABLET | ORAL | Status: DC | PRN

## 2021-02-18 MED ORDER — DIBUCAINE (PERIANAL) 1 % EX OINT: 1.0000 "application " | TOPICAL_OINTMENT | CUTANEOUS | Status: DC | PRN

## 2021-02-18 MED ORDER — LIDOCAINE HCL (PF) 1 % IJ SOLN
INTRAMUSCULAR | Status: DC | PRN
Start: 2021-02-18 — End: 2021-02-18
  Administered 2021-02-18: 8 mL via EPIDURAL

## 2021-02-18 MED ORDER — MEASLES, MUMPS & RUBELLA VAC IJ SOLR
0.5000 mL | Freq: Once | INTRAMUSCULAR | Status: AC
  Administered 2021-02-19: 0.5 mL via SUBCUTANEOUS
  Filled 2021-02-18: qty 0.5

## 2021-02-18 MED ORDER — TETANUS-DIPHTH-ACELL PERTUSSIS 5-2.5-18.5 LF-MCG/0.5 IM SUSY: 0.5000 mL | PREFILLED_SYRINGE | Freq: Once | INTRAMUSCULAR | Status: DC

## 2021-02-18 MED ORDER — PRENATAL MULTIVITAMIN CH
1.0000 | ORAL_TABLET | Freq: Every day | ORAL | Status: DC
  Administered 2021-02-18 – 2021-02-19 (×2): 1 via ORAL
  Filled 2021-02-18 (×2): qty 1

## 2021-02-18 MED ORDER — DULOXETINE HCL 60 MG PO CPEP
60.0000 mg | ORAL_CAPSULE | Freq: Every day | ORAL | Status: DC
  Administered 2021-02-18: 60 mg via ORAL
  Filled 2021-02-18: qty 1

## 2021-02-18 MED ORDER — IBUPROFEN 600 MG PO TABS
600.0000 mg | ORAL_TABLET | Freq: Four times a day (QID) | ORAL | Status: DC
  Administered 2021-02-18 – 2021-02-19 (×5): 600 mg via ORAL
  Filled 2021-02-18 (×5): qty 1

## 2021-02-18 MED ORDER — MEDROXYPROGESTERONE ACETATE 150 MG/ML IM SUSP
150.0000 mg | Freq: Once | INTRAMUSCULAR | Status: AC
  Administered 2021-02-19: 150 mg via INTRAMUSCULAR
  Filled 2021-02-18: qty 1

## 2021-02-18 MED ORDER — WITCH HAZEL-GLYCERIN EX PADS: 1.0000 "application " | MEDICATED_PAD | CUTANEOUS | Status: DC | PRN

## 2021-02-18 MED ORDER — BENZOCAINE-MENTHOL 20-0.5 % EX AERO: 1.0000 "application " | INHALATION_SPRAY | CUTANEOUS | Status: DC | PRN

## 2021-02-18 MED ORDER — COCONUT OIL OIL: 1.0000 "application " | TOPICAL_OIL | Status: DC | PRN

## 2021-02-18 NOTE — Anesthesia Postprocedure Evaluation (Signed)
Anesthesia Post Note  Patient: Sara Foster  Procedure(s) Performed: AN AD HOC LABOR EPIDURAL     Patient location during evaluation: Mother Baby Anesthesia Type: Epidural Level of consciousness: awake, oriented and awake and alert Pain management: pain level controlled Vital Signs Assessment: post-procedure vital signs reviewed and stable Respiratory status: spontaneous breathing, respiratory function stable and nonlabored ventilation Cardiovascular status: stable Postop Assessment: no headache, adequate PO intake, able to ambulate, patient able to bend at knees, no backache and no apparent nausea or vomiting Anesthetic complications: no   No notable events documented.  Last Vitals:  Vitals:   02/18/21 1210 02/18/21 1315  BP: 114/72 115/67  Pulse: 82 84  Resp: 17 16  Temp: 36.8 C 36.9 C  SpO2:      Last Pain:  Vitals:   02/18/21 1315  TempSrc: Oral  PainSc: 2    Pain Goal: Patients Stated Pain Goal: 0 (02/18/21 0830)                 Shallen Luedke

## 2021-02-18 NOTE — Discharge Summary (Addendum)
Postpartum Discharge Summary     Patient Name: Sara Foster DOB: 25-Aug-1995 MRN: [id number]  Date of admission: 02/17/2021 Delivery date:02/18/2021  Delivering provider: Seabron Spates  Date of discharge: 02/19/2021  Admitting diagnosis: Normal labor and delivery [O80] Intrauterine pregnancy: [redacted]w[redacted]d    Secondary diagnosis:  Principal Problem:   Normal labor and delivery Active Problems:   Rubella non-immune status, antepartum   HSV-2 infection complicating pregnancy   GBS bacteriuria   Abnormal chromosomal and genetic finding on antenatal screening mother   Vaginal delivery  Additional problems: intermittent Hypertension starting at 23 weeks, normal labs    Discharge diagnosis: Term Pregnancy Delivered                                              Post partum procedures: none Augmentation: AROM Complications: None  Hospital course: Onset of Labor With Vaginal Delivery      25y.o. yo G2P1001 at 344w2das admitted in Latent Labor on 02/17/2021. Patient had an uncomplicated labor course as follows:  Membrane Rupture Time/Date: 1:02 AM ,02/18/2021   Delivery Method:Vaginal, Spontaneous  Episiotomy: None  Lacerations:  None  Patient had an uncomplicated postpartum course. Her BPs remained normotensive. She is ambulating, tolerating a regular diet, passing flatus, and urinating well. Patient is discharged home in stable condition on 02/19/21 per her request for early d/c as long as the baby can go. She expressed a desire for her son to be circumcised- she was consented and a note placed on his chart.  Newborn Data: Birth date:02/18/2021  Birth time:8:14 AM  Gender:Female  Living status:Living  Apgars:8 ,9  Weight:3730 g (8lb 3.6oz)  Magnesium Sulfate received: No BMZ received: No Rhophylac:N/A MMQBH:ALPFXTKostpartum T-DaP:Given prenatally Flu: N/A Transfusion:No  Physical exam  Vitals:   02/18/21 1642 02/18/21 2040 02/19/21 0048 02/19/21 0507  BP: 122/74 125/80  115/72 118/80  Pulse: 80 86 78 90  Resp: _0 Temp: 98 F (36.7 C) 97.6 F (36.4 C) 98.2 F (36.8 C) 97.6 F (36.4 C)  TempSrc: Oral Oral Oral Oral  SpO2:    99%   General: alert and cooperative Lochia: appropriate Uterine Fundus: firm Incision: N/A DVT Evaluation: No evidence of DVT seen on physical exam. Labs: Lab Results  Component Value Date   WBC 17.1 (H) 02/17/2021   HGB 12.0 02/17/2021   HCT 35.8 (L) 02/17/2021   MCV 89.3 02/17/2021   PLT 302 02/17/2021   CMP Latest Ref Rng & Units 11/08/2020  Glucose 70 - 99 mg/dL 78  BUN 6 - 20 mg/dL <5(L)  Creatinine 0.44 - 1.00 mg/dL 0.41(L)  Sodium 135 - 145 mmol/L 137  Potassium 3.5 - 5.1 mmol/L 3.6  Chloride 98 - 111 mmol/L 109  CO2 22 - 32 mmol/L 19(L)  Calcium 8.9 - 10.3 mg/dL 8.5(L)  Total Protein 6.5 - 8.1 g/dL 5.7(L)  Total Bilirubin 0.3 - 1.2 mg/dL 0.5  Alkaline Phos 38 - 126 U/L 65  AST 15 - 41 U/L 22  ALT 0 - 44 U/L 18   Edinburgh Score: Edinburgh Postnatal Depression Scale Screening Tool 02/18/2021  I have been able to laugh and see the funny side of things. 0  I have looked forward with enjoyment to things. 0  I have blamed myself unnecessarily when things went wrong. 0  I have been  anxious or worried for no good reason. 0  I have felt scared or panicky for no good reason. 0  Things have been getting on top of me. 0  I have been so unhappy that I have had difficulty sleeping. 0  I have felt sad or miserable. 0  I have been so unhappy that I have been crying. 0  The thought of harming myself has occurred to me. 0  Edinburgh Postnatal Depression Scale Total 0      After visit meds:  Allergies as of 02/19/2021   No Known Allergies      Medication List     STOP taking these medications    acyclovir 400 MG tablet Commonly known as: ZOVIRAX   Blood Pressure Kit Devi   NIFEdipine 10 MG capsule Commonly known as: Procardia   promethazine 25 MG tablet Commonly known as: PHENERGAN        TAKE these medications    DULoxetine 60 MG capsule Commonly known as: CYMBALTA Take 1 capsule (60 mg total) by mouth daily.   ibuprofen 600 MG tablet Commonly known as: ADVIL Take 1 tablet (600 mg total) by mouth every 6 (six) hours as needed.   montelukast 10 MG tablet Commonly known as: SINGULAIR Take 10 mg by mouth daily.   multivitamin-prenatal 27-0.8 MG Tabs tablet Take 1 tablet by mouth daily at 12 noon.        Please schedule this patient for Postpartum visit in: 1 week with the following provider: Any provider In-Person For C/S patients schedule nurse incision check in weeks 2 weeks: no High risk pregnancy complicated by: HTN developed in August (23 weeks) Delivery mode:  SVD Anticipated Birth Control:  IUD PP Procedures needed: BP check  Edinburgh: negative Schedule Integrated BH visit: no  No relevant baby issues   Discharge home in stable condition Infant Feeding: Bottle Infant Disposition:home with mother Discharge instruction: per After Visit Summary and Postpartum booklet. Activity: Advance as tolerated. Pelvic rest for 6 weeks.  Diet: routine diet Anticipated Birth Control: PP Depo given Postpartum Appointment:4 weeks Additional Postpartum F/U:  none Future Appointments: Future Appointments  Date Time Provider [place]  02/24/2021  3:50 PM Roma Schanz, CNM [facility]  03/11/2021 10:10 AM Myrtis Ser, CNM [facility]   Follow up Visit:      02/19/2021 Myrtis Ser, CNM 9:25 AM

## 2021-02-18 NOTE — Progress Notes (Signed)
Labor Progress Note Sara Foster is a 25 y.o. G2P1001 at [redacted]w[redacted]d presented for SOL  S:Doing well, feels comfortable with epidural. No concerns.   O:  BP (!) 106/59   Pulse 85   Temp 97.9 F (36.6 C) (Oral)   Resp 16   LMP 05/26/2020 (Exact Date)   SpO2 99%  EFM: 140/+accels/no decels CTX: every 3-4 min  CVE: Dilation: 6.5 Effacement (%): 70 Station: -1 Presentation: Vertex Exam by:: Sula Rumple  A&P: 24 y.o. G2P1001 [redacted]w[redacted]d here for SOL.  #Labor: AROM at 0102 with clear fluid. Has made cervical change. Expectant labor management. Pitocin if fails to progress.  #Pain: Epidural #FWB: cat 1 #GBS positive>PCN  (chronic/other problems) #HSV: Began Acyclovir 400mg  TID on 11/9. No active lesions at this time. #Depression with Anxiety: Duloxetine 60mg  daily  13/9, MD PGY-2 7:12 AM

## 2021-02-18 NOTE — Anesthesia Preprocedure Evaluation (Signed)
Anesthesia Evaluation  Patient identified by MRN, date of birth, ID band Patient awake    Reviewed: Allergy & Precautions, H&P , NPO status , Patient's Chart, lab work & pertinent test results, reviewed documented beta blocker date and time   Airway Mallampati: I  TM Distance: >3 FB Neck ROM: full    Dental no notable dental hx. (+) Teeth Intact, Dental Advisory Given   Pulmonary neg pulmonary ROS,    Pulmonary exam normal breath sounds clear to auscultation       Cardiovascular negative cardio ROS Normal cardiovascular exam Rhythm:regular Rate:Normal     Neuro/Psych  Headaches, PSYCHIATRIC DISORDERS Anxiety Depression    GI/Hepatic negative GI ROS, Neg liver ROS,   Endo/Other  negative endocrine ROS  Renal/GU negative Renal ROS  negative genitourinary   Musculoskeletal   Abdominal   Peds  Hematology negative hematology ROS (+)   Anesthesia Other Findings   Reproductive/Obstetrics (+) Pregnancy                             Anesthesia Physical Anesthesia Plan  ASA: 2  Anesthesia Plan: Epidural   Post-op Pain Management:    Induction:   PONV Risk Score and Plan: Treatment may vary due to age or medical condition  Airway Management Planned: Natural Airway  Additional Equipment: None  Intra-op Plan:   Post-operative Plan:   Informed Consent: I have reviewed the patients History and Physical, chart, labs and discussed the procedure including the risks, benefits and alternatives for the proposed anesthesia with the patient or authorized representative who has indicated his/her understanding and acceptance.     Dental Advisory Given  Plan Discussed with: Anesthesiologist  Anesthesia Plan Comments: (Labs checked- platelets confirmed with RN in room. Fetal heart tracing, per RN, reported to be stable enough for sitting procedure. Discussed epidural, and patient consents to the  procedure:  included risk of possible headache,backache, failed block, allergic reaction, and nerve injury. This patient was asked if she had any questions or concerns before the procedure started.)        Anesthesia Quick Evaluation

## 2021-02-18 NOTE — Progress Notes (Signed)
Labor Progress Note Sara Foster is a 25 y.o. G2P1001 at [redacted]w[redacted]d presented for SOL  S:Doing well, feels comfortable after epidural. Denies LOF.    O:  BP 128/73   Pulse (!) 123   Temp 97.9 F (36.6 C) (Oral)   LMP 05/26/2020 (Exact Date)   SpO2 100%  EFM: 140/+accels/no decels  CVE: Dilation: 5.5 Effacement (%): 70 Station: -2 Presentation: Vertex Exam by:: Dr. Betti Cruz  A&P: 25 y.o. G2P1001 [redacted]w[redacted]d here for SOL.  #Labor: Contractions have spaced out. AROM at 0102 with clear fluid. Will continue to monitor and can consider starting pitocin if needed #Pain: Epidural #FWB: cat 1 #GBS positive>PCN  (chronic/other problems) #HSV: Began Acyclovir 400mg  TID on 11/9. No active lesions at this time. #Depression with Anxiety: Duloxetine 60mg  daily  13/9, MD PGY-2 1:41 AM

## 2021-02-18 NOTE — Anesthesia Procedure Notes (Signed)
Epidural Patient location during procedure: OB Start time: 02/18/2021 12:07 AM End time: 02/18/2021 12:13 AM  Staffing Anesthesiologist: Bethena Midget, MD  Preanesthetic Checklist Completed: patient identified, IV checked, site marked, risks and benefits discussed, surgical consent, monitors and equipment checked, pre-op evaluation and timeout performed  Epidural Patient position: sitting Prep: DuraPrep and site prepped and draped Patient monitoring: continuous pulse ox and blood pressure Approach: midline Location: L3-L4 Injection technique: LOR air  Needle:  Needle type: Tuohy  Needle gauge: 17 G Needle length: 9 cm and 9 Needle insertion depth: 5 cm cm Catheter type: closed end flexible Catheter size: 19 Gauge Catheter at skin depth: 10 cm Test dose: negative  Assessment Events: blood not aspirated, injection not painful, no injection resistance, no paresthesia and negative IV test

## 2021-02-19 MED ORDER — IBUPROFEN 600 MG PO TABS: 600.0000 mg | ORAL_TABLET | Freq: Four times a day (QID) | ORAL | 0 refills | Status: DC | PRN

## 2021-02-19 NOTE — Progress Notes (Deleted)
Patient ID: Sara Foster, female   DOB: Oct 12, 1995, 25 y.o.   MRN: [id number]  CNM Circumcision Counseling Progress Note  Patient desires circumcision for her female infant.  Circumcision procedure details discussed, risks and benefits of procedure were also discussed.  These include but are not limited to: Benefits of circumcision in men include reduction in the rates of urinary tract infection (UTI), penile cancer, some sexually transmitted infections, penile inflammatory and retractile disorders, as well as easier hygiene.  Risks include bleeding , infection, injury of glans which may lead to penile deformity or urinary tract issues, unsatisfactory cosmetic appearance and other potential complications related to the procedure.  It was emphasized that this is an elective procedure.  Patient wants to proceed with circumcision; written informed consent will be obtained.  Will have MD do circumcision when infant is cleared for such by peds.  [name] CNM 02/19/2021   9:37 AM

## 2021-02-19 NOTE — Progress Notes (Signed)
POSTPARTUM PROGRESS NOTE  Post Partum Day 1  Subjective:  Sara Foster is a 25 y.o. [id number] s/p SVD at [redacted]w[redacted]d.  She reports she is doing well. No acute events overnight. She denies any problems with ambulating, voiding or po intake. Denies nausea or vomiting.  Pain is well controlled.  Lochia is improving and slightly more than a normal period without clots. Pt denies lightheadedness or dizziness.   Objective: Blood pressure 115/72, pulse 78, temperature 98.2 F (36.8 C), temperature source Oral, resp. rate 18, last menstrual period 05/26/2020, SpO2 98 %, unknown if currently breastfeeding.  Physical Exam:  General: alert, cooperative and no distress Chest: no respiratory distress Heart:regular rate, distal pulses intact Abdomen: soft, nontender,  Uterine Fundus: firm, appropriately tender DVT Evaluation: No calf swelling or tenderness Extremities: No edema Skin: warm, dry   Recent Labs    02/17/21 2120  HGB 12.0  HCT 35.8*    Assessment/Plan: Sara Foster is a 25 y.o. [id number] s/p SVD at [redacted]w[redacted]d   PPD#1 - Doing well  Routine postpartum care #Elevated BP in pregnancy: BP has been WNL since yesterday morning  #Contraception: Depo peripartum with plan for IUD in office #Feeding: Breast Dispo: Plan for discharge later today.   LOS: 2 days   Marcelline Mates, MS3 02/19/2021, 4:27 AM

## 2021-02-19 NOTE — Social Work (Signed)
MOB was referred for history of depression/anxiety.  * Referral screened out by Clinical Social Worker because none of the following criteria appear to apply: ~ History of anxiety/depression during this pregnancy, or of post-partum depression following prior delivery. ~ Diagnosis of anxiety and/or depression within last 3 years OR * MOB's symptoms currently being treated with medication and/or therapy. Per chart review, MOB takes Cymbalta for anxiety and depression symptoms.   Please contact the Clinical Social Worker if needs arise, by [facility] request, or if MOB scores greater than 9/yes to question 10 on Edinburgh Postpartum Depression Screen.   Vivi Barrack, MSW, LCSW [facility]  Clinical Social Worker  [phone] 11/12/2020  8:27 AM

## 2021-02-24 ENCOUNTER — Encounter: Payer: Medicaid Other | Admitting: Women's Health

## 2021-02-27 ENCOUNTER — Encounter: Payer: Medicaid Other | Admitting: Obstetrics & Gynecology

## 2021-03-05 ENCOUNTER — Encounter: Payer: Medicaid Other | Admitting: Women's Health

## 2021-03-05 ENCOUNTER — Telehealth (HOSPITAL_COMMUNITY): Payer: Self-pay | Admitting: *Deleted

## 2021-03-05 NOTE — Telephone Encounter (Signed)
Attempted hospital discharge follow-up call. Mailbox full. Deforest Hoyles, RN, 03/05/21, [phone]

## 2021-03-11 ENCOUNTER — Ambulatory Visit: Payer: Medicaid Other | Admitting: Advanced Practice Midwife

## 2021-03-18 ENCOUNTER — Encounter: Payer: Self-pay | Admitting: Adult Health

## 2021-03-18 ENCOUNTER — Ambulatory Visit (INDEPENDENT_AMBULATORY_CARE_PROVIDER_SITE_OTHER): Payer: Medicaid Other | Admitting: Adult Health

## 2021-03-18 ENCOUNTER — Ambulatory Visit: Payer: Medicaid Other | Admitting: Advanced Practice Midwife

## 2021-03-18 ENCOUNTER — Other Ambulatory Visit: Payer: Self-pay

## 2021-03-18 DIAGNOSIS — F53 Postpartum depression: Secondary | ICD-10-CM | POA: Diagnosis not present

## 2021-03-18 NOTE — Progress Notes (Signed)
Patient ID: Sara Foster, female   DOB: [birth year], 26 y.o.   MRN: [id number]      POSTPARTUM VISIT Patient name: Sara Foster MRN [id number]  Date of birth: Jan 05, 1996 Chief Complaint:   Postpartum Care  History of Present Illness:   ADRAINE BIFFLE is a 26 y.o. G39P2002 Caucasian female being seen today for a postpartum visit. She is 4 weeks postpartum following a spontaneous vaginal delivery at 71w2dgestational weeks. IOL: no, for n/a. Anesthesia: epidural.  Laceration: none.  Complications: none. Inpatient contraception: got depo, but is not having sex.   She is returning to work on Monday at [employer] [place] P[place]  Tobacco use: no. Substance use disorder: no. Last pap smear: 08/28/2018 and results were normal. Next pap smear due: in 6 months.  No LMP recorded.  Postpartum course has been depressed, FOB died 112-17-2022 Bleeding spotting. Bowel function is normal. Bladder function is normal. Urinary incontinence? no, fecal incontinence? no Patient is not sexually active. Last sexual activity: prior to birth of baby. Desired contraception: Depo.in hospital, but abstinence now.  Patient does not want a pregnancy in the future.  Desired family size is 2 children.   Upstream - 03/18/21 1352       Pregnancy Intention Screening   Does the patient want to become pregnant in the next year? No    Does the patient's partner want to become pregnant in the next year? No    Would the patient like to discuss contraceptive options today? No      Contraception Wrap Up   Current Method Abstinence;Hormonal Injection    End Method Abstinence    Contraception Counseling Provided No              The pregnancy intention screening data noted above was reviewed. Potential methods of contraception were discussed. The patient elected to proceed with Abstinence.  Edinburgh Postpartum Depression Screening:   Edinburgh Postnatal Depression Scale - 03/18/21 1346        Edinburgh Postnatal Depression Scale:  In the Past 7 Days   I have been able to laugh and see the funny side of things. 2    I have looked forward with enjoyment to things. 3    I have blamed myself unnecessarily when things went wrong. 2    I have been anxious or worried for no good reason. 3    I have felt scared or panicky for no good reason. 3    Things have been getting on top of me. 3    I have been so unhappy that I have had difficulty sleeping. 3    I have felt sad or miserable. 3    I have been so unhappy that I have been crying. 2    The thought of harming myself has occurred to me. 0    Edinburgh Postnatal Depression Scale Total 24             GAD 7 : Generalized Anxiety Score 08/22/2020  Nervous, Anxious, on Edge 3  Control/stop worrying 2  Worry too much - different things 3  Trouble relaxing 3  Restless 2  Easily annoyed or irritable 3  Afraid - awful might happen 0  Total GAD 7 Score 16   She is on cymblata and xanax [place], [facility] [place]   Baby's course has been uncomplicated. Baby is feeding by bottle. Infant has a pediatrician/family doctor? Yes.  Childcare strategy if returning to work/school: family.  Pt has material needs met for her and baby: Yes.   Review of Systems:   Pertinent items are noted in HPI Denies Abnormal vaginal discharge w/ itching/odor/irritation, headaches, visual changes, shortness of breath, chest pain, abdominal pain, severe nausea/vomiting, or problems with urination or bowel movements. Pertinent History Reviewed:  Reviewed past medical,surgical, obstetrical and family history.  Reviewed problem list, medications and allergies. OB History  Gravida Para Term Preterm AB Living  '2 2 2     2  ' SAB IAB Ectopic Multiple Live Births        0 2    # Outcome Date GA Lbr Len/2nd Weight Sex Delivery Anes PTL Lv  2 Term 02/18/21 [redacted]w[redacted]d/ 00:09 8 lb 3.6 oz (3.73 kg) M Vag-Spont EPI  LIV     Birth Comments: WDL  1 Term 12/09/13 42w0d14:05 / 02:26 7 lb 6.3 oz (3.355 kg) F Vag-Spont EPI N LIV   Physical Assessment:   Vitals:   03/18/21 1342 03/18/21 1416  BP: (!) 151/99 126/87  Pulse: (!) 129 (!) 114  Weight: 143 lb 12.8 oz (65.2 kg)   Height: '5\' 3"'  (1.6 m)   Body mass index is 25.47 kg/m.        Physical Examination:   General appearance: alert, well appearing, and in no distress  Mental status: alert, oriented to person, place, and time, normal mood, behavior, speech, dress, motor activity, and thought processes  Skin: warm & dry   Cardiovascular: normal heart rate noted   Respiratory: normal respiratory effort, no distress   Breasts: deferred, no complaints   Abdomen: soft, non-tender   Pelvic: exam declined by the patient. Thin prep pap obtained: No  Rectal: not examined  Extremities: Edema: none   Chaperone: N/A         No results found for this or any previous visit (from the past 24 hour(s)).  Assessment & Plan:  1) Postpartum exam 2) 4 wks s/p spontaneous vaginal delivery 3) bottle feeding 4) Depression screening.+depression, call [facility] for grief counseling and follow up with [place]CP at [place] 5) Contraception counseling  Essential components of care per ACOG recommendations:  1.  Mood and well being:  If positive depression screen, discussed and plan developed.  If using tobacco we discussed reduction/cessation and risk of relapse If current substance abuse, we discussed and referral to local resources was offered.   2. Infant care and feeding:  If breastfeeding, discussed returning to work, pumping, breastfeeding-associated pain, guidance regarding return to fertility while lactating if not using another method. If needed, patient was provided with a letter to be allowed to pump q 2-3hrs to support lactation in a private location with access to a refrigerator to store breastmilk.   Recommended that all caregivers be immunized for flu, pertussis and other preventable communicable diseases If pt  does not have material needs met for her/baby, referred to local resources for help obtaining these.  3. Sexuality, contraception and birth spacing Provided guidance regarding sexuality, management of dyspareunia, and resumption of intercourse Discussed avoiding interpregnancy interval <1m72m and recommended birth spacing of 18 months  4. Sleep and fatigue Discussed coping options for fatigue and sleep disruption Encouraged family/partner/community support of 4 hrs of uninterrupted sleep to help with mood and fatigue  5. Physical recovery  If pt had a C/S, assessed incisional pain and providing guidance on normal vs prolonged recovery If pt had a laceration, perineal healing and pain reviewed.  If urinary or fecal incontinence, discussed  management and referred to PT or uro/gyn if indicated  Patient is safe to resume physical activity. Discussed attainment of healthy weight.  6.  Chronic disease management Discussed pregnancy complications if any, and their implications for future childbearing and long-term maternal health. Review recommendations for prevention of recurrent pregnancy complications, such as 17 hydroxyprogesterone caproate to reduce risk for recurrent PTB not applicable, or aspirin to reduce risk of preeclampsia not applicable. Pt had GDM: no. If yes, 2hr GTT scheduled: no. Reviewed medications and non-pregnant dosing including consideration of whether pt is breastfeeding using a reliable resource such as LactMed: not applicable Referred for f/u w/ PCP or subspecialist providers as indicated: yes  7. Health maintenance Mammogram at 28yo or earlier if indicated Pap smears as indicated  Meds: No orders of the defined types were placed in this encounter.   Follow-up: return in 6 months for pap and physical or sooner if needed     Derrek Monaco NP  03/18/2021 2:40 PM

## 2021-06-02 ENCOUNTER — Other Ambulatory Visit: Payer: Self-pay | Admitting: Adult Health

## 2021-06-12 ENCOUNTER — Ambulatory Visit (INDEPENDENT_AMBULATORY_CARE_PROVIDER_SITE_OTHER): Payer: Medicaid Other | Admitting: Adult Health

## 2021-06-12 ENCOUNTER — Encounter: Payer: Self-pay | Admitting: Adult Health

## 2021-06-12 DIAGNOSIS — Z30011 Encounter for initial prescription of contraceptive pills: Secondary | ICD-10-CM

## 2021-06-12 DIAGNOSIS — F53 Postpartum depression: Secondary | ICD-10-CM | POA: Diagnosis not present

## 2021-06-12 MED ORDER — LO LOESTRIN FE 1 MG-10 MCG / 10 MCG PO TABS: 1.0000 | ORAL_TABLET | Freq: Every day | ORAL | 11 refills | Status: DC

## 2021-06-12 NOTE — Progress Notes (Signed)
Patient ID: Sara Foster, female   DOB: 1995/10/07, 26 y.o.   MRN: [id number] ? ? ?TELEHEALTH GYNECOLOGY VISIT ENCOUNTER NOTE ? ?Provider location: [facility] [employer] [facility]  ? ?Patient location: Home ? ?I connected with Akiba Melfi Dormer on 06/12/21 at 11:50 AM EDT by telephone and verified that I am speaking with the correct person using two identifiers. Patient was unable to do MyChart audiovisual encounter due to technical difficulties, she tried several times.  ?  ?I discussed the limitations, risks, security and privacy concerns of performing an evaluation and management service by telephone and the availability of in person appointments. I also discussed with the patient that there may be a patient responsible charge related to this service. The patient expressed understanding and agreed to proceed. ?  ?History:  ?Sara Foster is a 26 y.o. G91P2002 female being evaluated today for birth control and postpartum depression, is seeing PCP and has medications changed is taking Cymbalta 60 mg bid and xanax 1 mg bid prn. She denies any abnormal vaginal discharge, pelvic pain or other concerns.   ? she denies MI,stroke, DVT,breast cancer or migraine with aura and wants a pill, she started period today and is not breast feeding.  ? PCP is [facility]. ? ?Past Medical History:  ?Diagnosis Date  ? Chlamydia 2015  ? Encounter for education about contraceptive use 03/24/2015  ? Generalized anxiety disorder   ? Gonorrhea 2015  ? Low back pain   ? Migraines   ? OAB (overactive bladder) 05/06/2014  ? ?Past Surgical History:  ?Procedure Laterality Date  ? WISDOM TOOTH EXTRACTION    ? ?The following portions of the patient's history were reviewed and updated as appropriate: allergies, current medications, past family history, past medical history, past social history, past surgical history and problem list.  ? ?Health Maintenance:  ?Lab Results  ?Component Value Date  ? DIAGPAP  08/28/2018  ?  NEGATIVE  FOR INTRAEPITHELIAL LESIONS OR MALIGNANCY. BENIGN REACTIVE/REPARATIVE CHANGES.  ?  ? ?Review of Systems:  ?Pertinent items noted in HPI and remainder of comprehensive ROS otherwise negative. ? ?Physical Exam:  ? ?General:  Alert, oriented and cooperative.   ?Mental Status: Normal mood and affect perceived. Normal judgment and thought content.  ?Physical exam deferred due to nature of the encounter ? LMP 06/12/2021 (Exact Date)   Breastfeeding No   ? EPDS was 50 was 33 in January  ?Labs and Imaging ?No results found for this or any previous visit (from the past 336 hour(s)). ?No results found.    ?Assessment and Plan:  ?   ?1. Postpartum depression ?Continue meds from PCP ?Has follow with PCP in 2-3 months  ? ?2. Encounter for initial prescription of contraceptive pills ?Will rx lo Loestrin, can start today ?Meds ordered this encounter  ?Medications  ? Norethindrone-Ethinyl Estradiol-Fe Biphas (LO LOESTRIN FE) 1 MG-10 MCG / 10 MCG tablet  ?  Sig: Take 1 tablet by mouth daily. Take 1 daily by mouth  ?  Dispense:  28 tablet  ?  Refill:  11  ?  BIN [id number], PCN [id number], GRP [id number]  ?  Order Specific Question:   Supervising Provider  ?  Answer:   Duane Lope H [[id number]]  ? Follow up in 8 weeks for ROS  ? ?   ?  ?I discussed the assessment and treatment plan with the patient. The patient was provided an opportunity to ask questions and all were answered. The patient agreed with  the plan and demonstrated an understanding of the instructions. ?  ?The patient was advised to call back or seek an in-person evaluation/go to the ED if the symptoms worsen or if the condition fails to improve as anticipated. ? ?I provided 10 minutes of non-face-to-face time during this encounter. ?I was in my office at [facility] during this encounter. ? ?Cyril Mourning, NP ?[facility] [employer], [facility]  ?

## 2021-08-03 ENCOUNTER — Ambulatory Visit: Payer: Medicaid Other | Admitting: Adult Health

## 2021-08-03 ENCOUNTER — Encounter: Payer: Self-pay | Admitting: Adult Health

## 2021-08-03 VITALS — Ht 63.0 in | Wt 133.0 lb

## 2021-08-03 DIAGNOSIS — Z3041 Encounter for surveillance of contraceptive pills: Secondary | ICD-10-CM

## 2021-08-03 DIAGNOSIS — N926 Irregular menstruation, unspecified: Secondary | ICD-10-CM

## 2021-08-03 MED ORDER — NORETHIN ACE-ETH ESTRAD-FE 1-20 MG-MCG PO TABS: 1.0000 | ORAL_TABLET | Freq: Every day | ORAL | 11 refills | Status: DC

## 2021-08-03 NOTE — Progress Notes (Signed)
Patient ID: Sara Foster, female   DOB: 01/13/1996, 26 y.o.   MRN: [id number]   TELEHEALTH GYNECOLOGY VISIT ENCOUNTER NOTE  [facility]   Patient location: work  I connected with Sara Foster on 08/03/21 at 11:50 AM EDT by telephone and verified that I am speaking with the correct person using two identifiers. Patient was unable to do MyChart audiovisual encounter due to technical difficulties, she tried several times.    I discussed the limitations, risks, security and privacy concerns of performing an evaluation and management service by telephone and the availability of in person appointments. I also discussed with the patient that there may be a patient responsible charge related to this service. The patient expressed understanding and agreed to proceed.   History:  Sara Foster is a 26 y.o. G49P2002 female being evaluated today for irregular bleeding on lo Loestrin. She denies any abnormal vaginal discharge, pelvic pain or other concerns.       Past Medical History:  Diagnosis Date   Chlamydia 2015   Encounter for education about contraceptive use 03/24/2015   Generalized anxiety disorder    Gonorrhea 2015   Low back pain    Migraines    OAB (overactive bladder) 05/06/2014   Past Surgical History:  Procedure Laterality Date   WISDOM TOOTH EXTRACTION     The following portions of the patient's history were reviewed and updated as appropriate: allergies, current medications, past family history, past medical history, past social history, past surgical history and problem list.   Health Maintenance:  Lab Results  Component Value Date   DIAGPAP  08/28/2018    NEGATIVE FOR INTRAEPITHELIAL LESIONS OR MALIGNANCY. BENIGN REACTIVE/REPARATIVE CHANGES.     Review of Systems:  Pertinent items noted in HPI and remainder of comprehensive ROS otherwise negative.  Physical Exam:   General:  Alert, oriented and  cooperative.   Mental Status: Normal mood and affect perceived. Normal judgment and thought content.  Physical exam deferred due to nature of the encounter Ht 5\' 3"  (1.6 m)   Wt 133 lb (60.3 kg)   LMP 08/02/2021   Breastfeeding No   BMI 23.56 kg/m   Fall risk is low  Upstream - 08/03/21 1217       Pregnancy Intention Screening   Does the patient want to become pregnant in the next year? No    Does the patient's partner want to become pregnant in the next year? No    Would the patient like to discuss contraceptive options today? No      Contraception Wrap Up   Current Method Oral Contraceptive    End Method Oral Contraceptive             Labs and Imaging No results found for this or any previous visit (from the past 336 hour(s)). No results found.    Assessment and Plan:      1. Encounter for surveillance of contraceptive pills Finish current pack for OCs and start junel 1/20, use condoms for 1 pack   2. Irregular bleeding Will change OCs to higher dose Meds ordered this encounter  Medications   norethindrone-ethinyl estradiol-FE (LOESTRIN FE) 1-20 MG-MCG tablet    Sig: Take 1 tablet by mouth daily.    Dispense:  28 tablet    Refill:  11    Order Specific Question:   Supervising Provider    Answer:   2/20 [[id number]]    She thinks  she wants mirena but can't miss work for now, will call and make Friday appt when she can get off work       I discussed the assessment and treatment plan with the patient. The patient was provided an opportunity to ask questions and all were answered. The patient agreed with the plan and demonstrated an understanding of the instructions.   The patient was advised to call back or seek an in-person evaluation/go to the ED if the symptoms worsen or if the condition fails to improve as anticipated.  I provided 10  minutes of non-face-to-face time during this encounter. I was in my office at [facility] during this encounter   Cyril Mourning, NP [facility] [employer], [facility]

## 2021-08-07 ENCOUNTER — Ambulatory Visit: Payer: Medicaid Other | Admitting: Adult Health

## 2022-01-20 ENCOUNTER — Other Ambulatory Visit: Payer: Self-pay | Admitting: Advanced Practice Midwife

## 2022-08-21 ENCOUNTER — Other Ambulatory Visit: Payer: Self-pay | Admitting: Adult Health

## 2023-09-04 ENCOUNTER — Other Ambulatory Visit: Payer: Self-pay | Admitting: Adult Health

## 2024-01-03 ENCOUNTER — Other Ambulatory Visit: Payer: Self-pay | Admitting: Adult Health
